# Patient Record
Sex: Male | Born: 1955 | Race: White | Hispanic: Refuse to answer | Marital: Married | Smoking: Never smoker
Health system: Southern US, Community
[De-identification: ages and names within clinical notes are randomized; demographics above are authoritative.]

## PROBLEM LIST (undated history)

## (undated) DIAGNOSIS — J302 Other seasonal allergic rhinitis: Secondary | ICD-10-CM

## (undated) DIAGNOSIS — J930 Spontaneous tension pneumothorax: Secondary | ICD-10-CM

## (undated) DIAGNOSIS — T8859XA Other complications of anesthesia, initial encounter: Secondary | ICD-10-CM

## (undated) DIAGNOSIS — K449 Diaphragmatic hernia without obstruction or gangrene: Secondary | ICD-10-CM

## (undated) DIAGNOSIS — Z8489 Family history of other specified conditions: Secondary | ICD-10-CM

## (undated) DIAGNOSIS — R112 Nausea with vomiting, unspecified: Secondary | ICD-10-CM

## (undated) DIAGNOSIS — T4145XA Adverse effect of unspecified anesthetic, initial encounter: Secondary | ICD-10-CM

## (undated) DIAGNOSIS — Z9889 Other specified postprocedural states: Secondary | ICD-10-CM

## (undated) DIAGNOSIS — R0602 Shortness of breath: Secondary | ICD-10-CM

## (undated) DIAGNOSIS — J189 Pneumonia, unspecified organism: Secondary | ICD-10-CM

## (undated) DIAGNOSIS — E782 Mixed hyperlipidemia: Secondary | ICD-10-CM

## (undated) DIAGNOSIS — R351 Nocturia: Secondary | ICD-10-CM

## (undated) DIAGNOSIS — K219 Gastro-esophageal reflux disease without esophagitis: Secondary | ICD-10-CM

## (undated) HISTORY — DX: Pneumonia, unspecified organism: J18.9

## (undated) HISTORY — DX: Diaphragmatic hernia without obstruction or gangrene: K44.9

## (undated) HISTORY — DX: Spontaneous tension pneumothorax: J93.0

## (undated) HISTORY — DX: Nocturia: R35.1

## (undated) HISTORY — PX: APPENDECTOMY: SHX54

## (undated) HISTORY — DX: Shortness of breath: R06.02

## (undated) HISTORY — DX: Mixed hyperlipidemia: E78.2

## (undated) HISTORY — PX: TONSILLECTOMY: SUR1361

## (undated) HISTORY — DX: Other seasonal allergic rhinitis: J30.2

## (undated) HISTORY — PX: GANGLION CYST EXCISION: SHX1691

## (undated) HISTORY — DX: Gastro-esophageal reflux disease without esophagitis: K21.9

---

## 1997-12-21 ENCOUNTER — Encounter: Payer: Self-pay | Admitting: Gastroenterology

## 2000-07-27 DIAGNOSIS — J930 Spontaneous tension pneumothorax: Secondary | ICD-10-CM

## 2000-07-27 HISTORY — DX: Spontaneous tension pneumothorax: J93.0

## 2001-07-09 ENCOUNTER — Inpatient Hospital Stay (HOSPITAL_COMMUNITY): Admission: EM | Admit: 2001-07-09 | Discharge: 2001-07-13 | Payer: Self-pay | Admitting: Cardiothoracic Surgery

## 2001-07-09 ENCOUNTER — Encounter: Payer: Self-pay | Admitting: Cardiothoracic Surgery

## 2001-07-10 ENCOUNTER — Encounter: Payer: Self-pay | Admitting: Cardiothoracic Surgery

## 2001-07-11 ENCOUNTER — Encounter: Payer: Self-pay | Admitting: Cardiothoracic Surgery

## 2001-07-12 ENCOUNTER — Encounter: Payer: Self-pay | Admitting: Cardiothoracic Surgery

## 2001-07-13 ENCOUNTER — Encounter: Payer: Self-pay | Admitting: Cardiothoracic Surgery

## 2001-07-22 ENCOUNTER — Encounter: Admission: RE | Admit: 2001-07-22 | Discharge: 2001-07-22 | Payer: Self-pay | Admitting: Cardiothoracic Surgery

## 2001-07-22 ENCOUNTER — Encounter: Payer: Self-pay | Admitting: Cardiothoracic Surgery

## 2001-10-26 ENCOUNTER — Encounter: Payer: Self-pay | Admitting: Gastroenterology

## 2008-09-16 ENCOUNTER — Emergency Department (HOSPITAL_COMMUNITY): Admission: EM | Admit: 2008-09-16 | Discharge: 2008-09-16 | Payer: Self-pay | Admitting: Emergency Medicine

## 2008-11-16 ENCOUNTER — Ambulatory Visit: Payer: Self-pay | Admitting: Gastroenterology

## 2008-11-16 DIAGNOSIS — K922 Gastrointestinal hemorrhage, unspecified: Secondary | ICD-10-CM | POA: Insufficient documentation

## 2008-11-16 DIAGNOSIS — K219 Gastro-esophageal reflux disease without esophagitis: Secondary | ICD-10-CM | POA: Insufficient documentation

## 2008-12-21 ENCOUNTER — Ambulatory Visit: Payer: Self-pay | Admitting: Gastroenterology

## 2010-11-11 LAB — POCT CARDIAC MARKERS
CKMB, poc: <1 ng/mL — ABNORMAL LOW (ref 1.0–8.0)
CKMB, poc: <1 ng/mL — ABNORMAL LOW (ref 1.0–8.0)
Myoglobin, poc: 84.5 ng/mL (ref 12–200)
Troponin i, poc: <0.05 ng/mL (ref 0.00–0.09)

## 2010-11-11 LAB — CBC
MCHC: 35.7 g/dL (ref 30.0–36.0)
Platelets: 174 10*3/uL (ref 150–400)
RDW: 13.1 % (ref 11.5–15.5)

## 2010-11-11 LAB — POCT I-STAT, CHEM 8
Calcium, Ion: 1.17 mmol/L (ref 1.12–1.32)
Creatinine, Ser: 1.2 mg/dL (ref 0.4–1.5)
Glucose, Bld: 101 mg/dL — ABNORMAL HIGH (ref 70–99)
HCT: 46 % (ref 39.0–52.0)
Hemoglobin: 15.6 g/dL (ref 13.0–17.0)
TCO2: 26 mmol/L (ref 0–100)

## 2010-11-11 LAB — DIFFERENTIAL
Basophils Absolute: 0 10*3/uL (ref 0.0–0.1)
Basophils Relative: 0 % (ref 0–1)
Lymphocytes Relative: 26 % (ref 12–46)
Neutro Abs: 4.4 10*3/uL (ref 1.7–7.7)

## 2010-11-11 LAB — D-DIMER, QUANTITATIVE: D-Dimer, Quant: <0.22 ug/mL-FEU (ref 0.00–0.48)

## 2010-12-12 NOTE — H&P (Signed)
[place]. [facility]  Patient:    Tyler Taylor, Tyler Taylor Visit Number: [id number] MRN: [id number]          Service Type: Attending:  Mikey Bussing, M.D. Dictated by:   Adair Patter, P.A.-C. Adm. Date:  07/09/01   CC:         CVTS office   History and Physical  CHIEF COMPLAINT:  Right pneumothorax.  HISTORY OF PRESENT ILLNESS:  This is a 55 year old man who reports he was scuba diving approximately one week ago, and began feeling tension on the right side of his chest after diving.  Despite this chest pain, he continued diving, and subsequently felt worse pressure on the right side of his chest after completion of his diving activity.  This chest pressure eventually resulted in the patient obtaining a chest x-ray which showed a right-sided pneumothorax.  The patient subsequently had right-sided chest tube placed. The patient had serial chest x-rays and chest tube was pulled approximately four days after initial placement.  Followup x-ray one day later showed the pneumothorax had returned.  Despite return of pneumothorax, the patient had not had another chest tube placed until further x-rays showed enlargement of the pneumothorax.  He had another chest tube placed because of this enlargement on July 09, 2001, and was transferred to [facility] under the care of Dr. Kathlee Nations Trigt.  Currently, the patient denies any shortness of breath and only reports some moderate discomfort secondary to chest tube placement.  PAST MEDICAL HISTORY: 1. Hiatal hernia. 2. Hemorrhoids.  PAST SURGICAL HISTORY: 1. Open appendectomy. 2. Tonsillectomy. 3. Excision of ganglion on left wrist.  ALLERGIES:  No known drug allergies.  MEDICATIONS: 1. Prilosec one daily. 2. Claritin D one daily.  SOCIAL HISTORY:  The patient lives at home with his wife.  He denies any alcohol, smoking, or illicit drug use.  FAMILY HISTORY:  He denies any family history of  myocardial infarction, hypertension, or cerebrovascular accident.  He has a great uncle who had diabetes mellitus.  Both grandmothers had a history of cancer, one with brain cancer and one with cervical cancer.  REVIEW OF SYSTEMS:  GENERAL:  The patient denies any fever, chills, night sweats, or frequent illnesses.  HEAD:  The patient denies any head injuries. The patient wears reading glasses.  He denies any glaucoma or cataracts. EARS:  He denies any hearing loss or frequent ear infections.  NOSE:  Denies any epistaxis or frequent signs of infection.  MOUTH:  He denies any problems with his dentition or frequent sore throats.  NECK:  He denies any lumps or masses in his neck, or pain with range of motion of neck.  CARDIOVASCULAR: The patient reports in the past he has had a history of chest pain which has been evaluated by his cardiologist with a negative stress test.  No further workup for this chest pain has been indicated.  Denies any history of hypertension or cardiac arrhythmias.  LUNGS:  The patient denies any asthma, bronchitis, or emphysema.  GI:  The patient denies any nausea, vomiting, diarrhea, or constipation.  He has a history of heartburn which was evaluated with EGD, confirming the diagnosis of hiatal hernia for which he takes Prilosec.  He also has a history of hemorrhoids.  Secondary to his hemorrhoids, he has had a positive Hemoccult stool and was evaluated with colonoscopy which was normal.  GU:  The patient denies any urinary tract infections, _________, or dysuria.  MUSCULOSKELETAL:  The  patient denies any arthralgia, arthritis, or myalgias.  NEUROLOGIC:  He denies any depression or memory loss.  PHYSICAL EXAMINATION:  VITAL SIGNS:  The patient is afebrile and has a blood pressure of 148/85, heart rate of 95 and regular, respirations 16.  He has a pulse oximetry of 97% on room air.  GENERAL:  The patient is alert and oriented x 3.  He is well-developed  and well-nourished, and in no state of apparent distress.  HEENT:  Head is atraumatic and normocephalic.  Eyes - pupils are equal, round and reactive to light and accommodation.  Extraocular movements are intact. No scleral icterus or nystagmus.  Ears - auditory acuity is grossly intact. Nose - nasal ______ is intact.  Sinuses are nontender.  Mouth is moist without exudate.  NECK:  Supple without JVD, carotid bruits, lymphadenopathy, or thyromegaly.  CARDIOVASCULAR EXAMINATION:  Revealed regular rate and rhythm without murmurs, gallops, or rubs.  LUNGS:  Bilaterally clear to auscultation, equal breath sounds bilaterally.  ABDOMEN:  Soft, nontender, and nondistended with positive bowel sounds in all four quadrants.  EXTREMITIES:  Revealed no cyanosis, clubbing, or edema, or gross orthopedic abnormalities.  NEUROLOGIC EXAMINATION:  Revealed no focal neurologic deficits.  Cranial nerves 2-12 are grossly intact.  LABORATORY DATA:  Chest x-ray done today reveals the right chest tube in good position.  His right lung is fully expanded.  The patient has a right chest tube in place with no air leak.  IMPRESSION:  Recurrent right pneumothorax, status post chest tube placement.  PLAN:  The patient will be admitted to [facility] under the care of Dr. Kathlee Nations Trigt.  His chest tube will be kept on waterseal, assuming his lung remains fully expanded.  We will follow him with serial chest x-rays, and hopefully remove chest tube in two to three days if lung stays fully expanded. Further intervention may be indicated if his chest tube fails to resolve his pneumothorax. Dictated by:   Adair Patter, P.A.-C. Attending:  Mikey Bussing, M.D. DD:  07/09/01 TD:  07/09/01 Job: [id number]

## 2010-12-12 NOTE — Discharge Summary (Signed)
[place]. [facility]  Patient:    Tyler Taylor, Tyler Taylor Visit Number: [id number] MRN: [id number]          Service Type: MED Location: [id number] Attending Physician:  Mikey Bussing Dictated by:   Loura Pardon, P.A. Admit Date:  07/09/2001 Disc. Date: 07/13/01   CC:         Jamesetta Geralds, M.D.   Discharge Summary  DATE OF BIRTH:  [birth year]  DISCHARGE DIAGNOSIS:  Right pneumothorax.  SECONDARY DIAGNOSES: 1. Hiatal hernia. 2. History of hemorrhoids. 3. Status post appendectomy. 4. Status post tonsillectomy. 5. Status post removal of left wrist ganglia.  PROCEDURE:  Monitoring of existing right anterior thoracostomy tube with serial x-rays while at [facility], Dr. Kathlee Nations Trigt attending surgeon.  DISCHARGE DISPOSITION:  Tyler Taylor presented to [facility] on July 09, 2001, with right anterior thoracostomy tube which had been placed at another facility.  This was actually his second thoracostomy tube.  He had a right pneumothorax after scuba diving in the [place].  A thoracostomy tube was placed here, and right pneumothorax recurred after removal of the tube after four days.  He has been given serial chest x-rays here at [facility], which have all showed full expansion of the right lung . He has been kept on no suction, and has had no drainage from the chest tube.  He did not exhibit any leak at any time.  He has remained afebrile, his mental status has been clear, he has been actively ambulating.  His pain has been controlled with oral analgesics.  His appetite is good.  He has full GI tract function. The chest tube was removed on July 12, 2001, and a followup chest x-ray showed a small less than 5% right apical pneumothorax.  The patient did relate that he felt suction of air into the chest with inspiration, and a followup x-ray was taken.  In addition, a pursestring suture was placed at the incision, and  dressing reapplied.  Chest x-ray in followup on the morning of July 13, 2001, showed no advancement of the right pneumothorax, and the patient was discharged.  DISCHARGE MEDICATIONS: 1. Percocet 5/325 mg one or two p.o. q.4-6h. p.r.n. pain. 2. Prilosec 20 mg q.d. 3. Claritin D one q.d.  HISTORY OF PRESENT ILLNESS:  Tyler Taylor is a 55 year old male.  He was scuba diving approximately one week ago, and began feeling tension on the right side of his chest after diving.  He did continue diving, and felt worse pressure on the right side after completing his diving activity.  The patient did obtain a chest x-ray which showed right-sided pneumothorax.  He had a right thoracostomy tube placed, and after serial chest x-rays, chest tube was pulled approximately 4 days after placement.  Followup chest x-ray one day later showed return of the pneumothorax.  A second chest tube was placed, and the patient was transferred by aerial ambulance to his home.  He presents to [facility] for further monitoring of his right pneumothorax and the right thoracostomy tube.  HOSPITAL COURSE:  Has been described in discharge disposition.  He was admitted to [facility] on July 09, 2001.  He has undergone serial chest x-rays daily.  They show no advancement or return of the pneumothorax. The patient has been maintained off suction.  The chest tube was removed on hospital day #4, with no subsequent return of pneumothorax. Dictated by:  Loura Pardon, P.A. Attending Physician:  Mikey Bussing DD:  07/12/01 TD:  07/13/01 Job: [id number]

## 2011-09-14 ENCOUNTER — Other Ambulatory Visit: Payer: Self-pay | Admitting: Otolaryngology

## 2011-09-14 DIAGNOSIS — J328 Other chronic sinusitis: Secondary | ICD-10-CM

## 2011-09-23 ENCOUNTER — Ambulatory Visit
Admission: RE | Admit: 2011-09-23 | Discharge: 2011-09-23 | Disposition: A | Discharge status: Home / Self Care | Payer: Private Health Insurance - Indemnity | Source: Ambulatory Visit | Attending: Otolaryngology | Admitting: Otolaryngology

## 2011-09-23 DIAGNOSIS — J328 Other chronic sinusitis: Secondary | ICD-10-CM

## 2011-09-23 MED ORDER — IOHEXOL 300 MG/ML  SOLN
75.0000 mL | Freq: Once | INTRAMUSCULAR | Status: AC | PRN
  Administered 2011-09-23: 75 mL via INTRAVENOUS

## 2011-10-26 DIAGNOSIS — J189 Pneumonia, unspecified organism: Secondary | ICD-10-CM

## 2011-10-26 HISTORY — DX: Pneumonia, unspecified organism: J18.9

## 2011-11-11 ENCOUNTER — Encounter: Payer: Self-pay | Admitting: Pulmonary Disease

## 2011-11-12 ENCOUNTER — Encounter: Payer: Self-pay | Admitting: Pulmonary Disease

## 2011-11-12 ENCOUNTER — Ambulatory Visit (INDEPENDENT_AMBULATORY_CARE_PROVIDER_SITE_OTHER): Payer: Private Health Insurance - Indemnity | Admitting: Pulmonary Disease

## 2011-11-12 VITALS — BP 138/80 | HR 105 | Temp 98.6°F | Wt 218.4 lb

## 2011-11-12 DIAGNOSIS — J309 Allergic rhinitis, unspecified: Secondary | ICD-10-CM | POA: Insufficient documentation

## 2011-11-12 DIAGNOSIS — J189 Pneumonia, unspecified organism: Secondary | ICD-10-CM

## 2011-11-12 MED ORDER — HYDROCOD POLST-CHLORPHEN POLST 10-8 MG/5ML PO LQCR: 5.0000 mL | Freq: Two times a day (BID) | ORAL | Status: DC | PRN

## 2011-11-12 NOTE — Assessment & Plan Note (Signed)
He is followed by Dr. Haroldine Laws and Dr. Kathyrn Lass group for this.

## 2011-11-12 NOTE — Progress Notes (Deleted)
  Subjective:    Patient ID: Tyler Taylor, male    DOB: 08-16-1955, 56 y.o.   MRN: [id number]  HPI    Review of Systems  Constitutional: Negative for fever, appetite change and unexpected weight change.  HENT: Positive for congestion. Negative for ear pain, sore throat, trouble swallowing and dental problem.   Respiratory: Positive for cough, chest tightness and wheezing. Negative for shortness of breath.   Cardiovascular: Negative for chest pain, palpitations and leg swelling.  Gastrointestinal: Negative for abdominal pain.  Musculoskeletal: Negative for joint swelling.  Skin: Negative for rash.  Neurological: Negative for headaches.  Psychiatric/Behavioral: Negative for dysphoric mood. The patient is not nervous/anxious.        Objective:   Physical Exam        Assessment & Plan:

## 2011-11-12 NOTE — Assessment & Plan Note (Addendum)
He has left lower lobe pneumonia.  He has clinical improvement with antibiotic therapy.  I have advised him to complete his prescribed course of antibiotics.  He is to finish his prescribed course of prednisone.  I don't think he needs a change to his antibiotics or additional prednisone  Will have him f/u in 2 weeks with chest xray to document clearing of his infiltrate.  I have refilled his tussionex.

## 2011-11-12 NOTE — Progress Notes (Signed)
Chief Complaint  Patient presents with  . Advice Only    Pt here to f/u on PNA dx 11/09/11. Pt c/o cough w/ cleasr phlem, wheezing and chest tx when lying down, loss of voice. denies any f/c/s/v    History of Present Illness: Tyler Taylor is a 56 y.o. male for evaluation of pneumonia.  His symptoms started about 10 days.  He had body aches, chills and felt feverish.  He then developed a dry cough, but did occasional have clear sputum.    He was seen in urgent care.  He declined chest xray or fluoroquinolone therapy.  He was started on flagyl and cefdinir.  He started to feel better, and was able to return to work.  He then developed hoarseness.  He was seen by ENT, and told he had some wheeze.  He was started on prednisone, tussionex and singulair.  He also had a chest xray which showed pneumonia.  He was then advised to have pulmonary evaluation.  He is followed by Dr. Kathyrn Lass group for allergies.  He has not been told he has asthma.  He gets asthmatic bronchitis with colds, but does not have trouble with his breathing otherwise.    He feels his cough is improving.  He denies rash, gland swelling, headaches, chest pain, nausea, diarrhea, or leg swelling.  He has not had recent sick exposures.  There is no prior history of pneumonia or tuberculosis.  There is no history of smoking.  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  He is concerned that his cough and hoarseness are affecting his ability to work.   Past Medical History  Diagnosis Date  . GERD (gastroesophageal reflux disease)   . Hiatal hernia   . Tension pneumothorax 2002    from diving  . Hemorrhoids     Past Surgical History  Procedure Date  . Appendectomy   . Tonsillectomy   . Ganglion cyst excision     left wrist    Current Outpatient Prescriptions on File Prior to Visit  Medication Sig Dispense Refill  .  loratadine (CLARITIN) 10 MG tablet Take 10 mg by mouth daily.      . mometasone (NASONEX) 50 MCG/ACT nasal spray Place 2 sprays into the nose daily. On hold since dx w/ pna      . montelukast (SINGULAIR) 10 MG tablet Take 10 mg by mouth daily.      . Olopatadine HCl (PATANASE) 0.6 % SOLN Place 2 puffs into the nose daily.        No Known Allergies  family history is not on file.   reports that he has never smoked. He does not have any smokeless tobacco history on file. He reports that he does not drink alcohol or use illicit drugs.  Review of Systems  Constitutional: Negative for fever, appetite change and unexpected weight change.  HENT: Positive for congestion. Negative for ear pain, sore throat, trouble swallowing and dental problem.   Respiratory: Positive for cough, chest tightness and wheezing. Negative for shortness of breath.   Cardiovascular: Negative for chest pain, palpitations and leg swelling.  Gastrointestinal: Negative for abdominal pain.  Musculoskeletal: Negative for joint swelling.  Skin: Negative for rash.  Neurological: Negative for headaches.  Psychiatric/Behavioral: Negative for dysphoric mood. The patient is not nervous/anxious.     Physical Exam: BP 138/80  Pulse 105  Temp(Src) 98.6 F (37 C) (Oral)  Wt 218 lb 6.4 oz (99.066 kg)  SpO2 96%   General - No distress HEENT - PERRLA, EOMI, no sinus tenderness, no oral exudate, no LAN, no thyromegaly, TM clear Cardiac - s1s2 regular, no murmur Chest - good air entry, no wheeze/rales/dullness, normal respiratory excursion Abdomen - soft, nontender, no organomegaly, + bowel sounds Extremities - no e/c/c  Neurologic - normal strength, CN intact Skin - no rashes Psychiatric - normal mood, behavior  CXR 11/09/11 ([facility])>>Lt medial lower lobe infiltrate  Assessment/Plan:  Outpatient Encounter Prescriptions as of 11/12/2011  Medication Sig Dispense Refill  . cefdinir (OMNICEF) 300 MG capsule Take 300  mg by mouth 2 (two) times daily.      . CHLORPHENIRAMINE-HYDROCODONE PO Take by mouth 3 (three) times daily.      Marland Kitchen loratadine (CLARITIN) 10 MG tablet Take 10 mg by mouth daily.      . metroNIDAZOLE (FLAGYL) 500 MG tablet Take 500 mg by mouth 2 (two) times daily.      . mometasone (NASONEX) 50 MCG/ACT nasal spray Place 2 sprays into the nose daily. On hold since dx w/ pna      . montelukast (SINGULAIR) 10 MG tablet Take 10 mg by mouth daily.      . Olopatadine HCl (PATANASE) 0.6 % SOLN Place 2 puffs into the nose daily.      . prednisoLONE 5 MG TABS Taper as directed      . pseudoephedrine-guaifenesin (MUCINEX D) 60-600 MG per tablet Take 1 tablet by mouth daily.      . chlorpheniramine-HYDROcodone (TUSSIONEX) 10-8 MG/5ML LQCR Take 5 mLs by mouth every 12 (twelve) hours as needed.  115 mL  0    [facility] [place] Pager:  [phone] 11/12/2011, 9:57 AM

## 2011-11-12 NOTE — Patient Instructions (Signed)
Follow up in 2 weeks with chest xray 

## 2011-11-30 ENCOUNTER — Ambulatory Visit (INDEPENDENT_AMBULATORY_CARE_PROVIDER_SITE_OTHER)
Admission: RE | Admit: 2011-11-30 | Discharge: 2011-11-30 | Disposition: A | Discharge status: Home / Self Care | Payer: Private Health Insurance - Indemnity | Source: Ambulatory Visit | Attending: Adult Health | Admitting: Adult Health

## 2011-11-30 ENCOUNTER — Ambulatory Visit (INDEPENDENT_AMBULATORY_CARE_PROVIDER_SITE_OTHER): Payer: Private Health Insurance - Indemnity | Admitting: Adult Health

## 2011-11-30 ENCOUNTER — Encounter: Payer: Self-pay | Admitting: Adult Health

## 2011-11-30 VITALS — BP 114/76 | HR 94 | Temp 97.7°F | Ht 73.0 in | Wt 222.8 lb

## 2011-11-30 DIAGNOSIS — J189 Pneumonia, unspecified organism: Secondary | ICD-10-CM

## 2011-11-30 NOTE — Progress Notes (Signed)
  Subjective:    Patient ID: Tyler Taylor, male    DOB: [birth year], 56 y.o.   MRN: [id number]  HPI 56 yo seen for initial pulmonary consult 11/12/11 for Pneumonia   11/30/2011 Follow up  Seen 2 weeks ago for PNA. Patient was referred from his allergist for evaluation of pneumonia. He. Chest x-ray showed a left lower lobe consolidation. He was treated with Flagyl and Ceftin air. Patient returns today for followup. He reports that he is feeling much improved with resolution of cough and congestion. Chest x-ray to date shows total resolution of consolidation. He denies any fever, chest pain, shortness of breath, leg swelling, or rash. He does feel that his energy level has returned back to his baseline. He is return back to work  Review of Systems Constitutional:   No  weight loss, night sweats,  Fevers, chills, fatigue, or  lassitude.  HEENT:   No headaches,  Difficulty swallowing,  Tooth/dental problems, or  Sore throat,                No sneezing, itching, ear ache, nasal congestion, post nasal drip,   CV:  No chest pain,  Orthopnea, PND, swelling in lower extremities, anasarca, dizziness, palpitations, syncope.   GI  No heartburn, indigestion, abdominal pain, nausea, vomiting, diarrhea, change in bowel habits, loss of appetite, bloody stools.   Resp: No shortness of breath with exertion or at rest.  No excess mucus, no productive cough,  No non-productive cough,  No coughing up of blood.  No change in color of mucus.  No wheezing.  No chest wall deformity  Skin: no rash or lesions.  GU: no dysuria, change in color of urine, no urgency or frequency.  No flank pain, no hematuria   MS:  No joint pain or swelling.  No decreased range of motion.  No back pain.  Psych:  No change in mood or affect. No depression or anxiety.  No memory loss.          Objective:   Physical Exam GEN: A/Ox3; pleasant , NAD, well nourished   HEENT:  [place]/AT,  EACs-clear, TMs-wnl, NOSE-clear, THROAT-clear, no  lesions, no postnasal drip or exudate noted.   NECK:  Supple w/ fair ROM; no JVD; normal carotid impulses w/o bruits; no thyromegaly or nodules palpated; no lymphadenopathy.  RESP  Clear  P & A; w/o, wheezes/ rales/ or rhonchi.no accessory muscle use, no dullness to percussion  CARD:  RRR, no m/r/g  , no peripheral edema, pulses intact, no cyanosis or clubbing.  GI:   Soft & nt; nml bowel sounds; no organomegaly or masses detected.  Musco: Warm bil, no deformities or joint swelling noted.   Neuro: alert, no focal deficits noted.    Skin: Warm, no lesions or rashes '   CXR : NAD      Assessment & Plan:

## 2011-11-30 NOTE — Patient Instructions (Signed)
Continue on same regimen  Follow up As needed  With Dr. Craige Cotta

## 2011-11-30 NOTE — Progress Notes (Signed)
Reviewed and agree with assessment/plan. 

## 2011-11-30 NOTE — Assessment & Plan Note (Signed)
Resolved on xray, pt clinically improved w/ return to his baseline  follow up As needed  With Dr. Craige Cotta

## 2011-12-11 ENCOUNTER — Telehealth: Payer: Self-pay | Admitting: Pulmonary Disease

## 2011-12-11 MED ORDER — HYDROCOD POLST-CHLORPHEN POLST 10-8 MG/5ML PO LQCR: 5.0000 mL | Freq: Two times a day (BID) | ORAL | Status: DC | PRN

## 2011-12-11 NOTE — Telephone Encounter (Signed)
rx printed and signed by Dr. Craige Cotta.. Rx given to the pt. Carron Curie, CMA

## 2011-12-11 NOTE — Telephone Encounter (Signed)
Pt is requesting a refill on tussionex. Last fill on 11-12-11 #135ml. Pt is in [place] waiting on Rx because he is going out of town and wants to have this with him. Please advise.Carron Curie, CMA

## 2011-12-11 NOTE — Telephone Encounter (Signed)
Okay to refill script with one refill.  He is not schedule for pulmonary follow up.  He will need to d/w his PCP in future for refill of this.  Send script for tussionex 5 ml bid prn cough, dispense 115 ml with one refill.

## 2012-06-20 ENCOUNTER — Telehealth: Payer: Self-pay | Admitting: Gastroenterology

## 2012-06-20 NOTE — Telephone Encounter (Signed)
Patient is scheduled to see Dr. Russella Dar on 06/27/12

## 2012-06-27 ENCOUNTER — Ambulatory Visit (INDEPENDENT_AMBULATORY_CARE_PROVIDER_SITE_OTHER): Payer: Private Health Insurance - Indemnity | Admitting: Gastroenterology

## 2012-06-27 ENCOUNTER — Encounter: Payer: Self-pay | Admitting: Gastroenterology

## 2012-06-27 VITALS — BP 136/82 | HR 92 | Ht 73.0 in | Wt 225.5 lb

## 2012-06-27 DIAGNOSIS — K921 Melena: Secondary | ICD-10-CM

## 2012-06-27 DIAGNOSIS — K649 Unspecified hemorrhoids: Secondary | ICD-10-CM

## 2012-06-27 MED ORDER — MESALAMINE 1000 MG RE SUPP: 1000.0000 mg | Freq: Every day | RECTAL | Status: DC

## 2012-06-27 NOTE — Progress Notes (Signed)
History of Present Illness: This is a 56 year old male with a history of internal and external hemorrhoids. He underwent colonoscopy in 1999 and 2010 both with the same findings of internal and external hemorrhoids. His hemorrhoids were injected with 23.4% saline in 2010. He states for about the past 2-3 years he has had frequent bright red rectal bleeding with bowel movements and occasional bright red bleeding in between bowel movements. His symptoms are generally worsening. Denies weight loss, abdominal pain, constipation, diarrhea, change in stool caliber, melena, nausea, vomiting, dysphagia, reflux symptoms, chest pain.  Review of Systems: Pertinent positive and negative review of systems were noted in the above HPI section. All other review of systems were otherwise negative.  Current Medications, Allergies, Past Medical History, Past Surgical History, Family History and Social History were reviewed in [employer] record.  Physical Exam: General: Well developed , well nourished, no acute distress Head: Normocephalic and atraumatic Eyes:  sclerae anicteric, EOMI Ears: Normal auditory acuity Mouth: No deformity or lesions Neck: Supple, no masses or thyromegaly Lungs: Clear throughout to auscultation Heart: Regular rate and rhythm; no murmurs, rubs or bruits Abdomen: Soft, non tender and non distended. No masses, hepatosplenomegaly or hernias noted. Normal Bowel sounds Rectal: Small external hemorrhoidal tags no internal lesions Hemoccult negative brown stool Musculoskeletal: Symmetrical with no gross deformities  Skin: No lesions on visible extremities Pulses:  Normal pulses noted Extremities: No clubbing, cyanosis, edema or deformities noted Neurological: Alert oriented x 4, grossly nonfocal Cervical Nodes:  No significant cervical adenopathy Inguinal Nodes: No significant inguinal adenopathy Psychological:  Alert and cooperative. Normal mood and  affect  Assessment and Recommendations:  1. Frequent hematochezia with a history of internal and external hemorrhoids. He failed injection sclerosis with 23.4% saline. Surgical referral for further evaluation and consideration of formal hemorrhoidectomy.

## 2012-06-27 NOTE — Patient Instructions (Addendum)
We have sent the following medications to your pharmacy for you to pick up at your convenience:Canasa suppositories.  We will call you with an appointment date and time with [facility] with Dr. Romie Levee.

## 2012-06-28 ENCOUNTER — Ambulatory Visit: Payer: Private Health Insurance - Indemnity | Admitting: Gastroenterology

## 2012-07-18 ENCOUNTER — Ambulatory Visit (INDEPENDENT_AMBULATORY_CARE_PROVIDER_SITE_OTHER): Payer: Private Health Insurance - Indemnity | Admitting: General Surgery

## 2012-07-18 ENCOUNTER — Encounter (INDEPENDENT_AMBULATORY_CARE_PROVIDER_SITE_OTHER): Payer: Self-pay | Admitting: General Surgery

## 2012-07-18 VITALS — BP 132/82 | HR 68 | Temp 97.8°F | Resp 18 | Ht 73.0 in | Wt 226.0 lb

## 2012-07-18 DIAGNOSIS — K625 Hemorrhage of anus and rectum: Secondary | ICD-10-CM

## 2012-07-18 NOTE — Progress Notes (Signed)
Chief Complaint  Patient presents with  . New Evaluation    hems    HISTORY: Tyler Taylor is a 56 y.o. male who presents to the office with rectal bleedings.   He has tried OTC and injections in the past with some success.  BM's makes the symptoms worse.  This had been occurring for several years.  He has a long standing history of constipation but has been taking fiber for the last 3 years with good results.  It is occurs with every BM and has been like that for at least 5 yrs.  His bowel habits are regular and his bowel movements are soft.  His fiber intake is good.  His last colonoscopy was 3 yrs ago by Dr Russella Dar. He injected his hemorrhoids at that time.  He denies any prolapsing tissue. He denies any itching or pain.  He is not on any blood thinners.     Past Medical History  Diagnosis Date  . GERD (gastroesophageal reflux disease)   . Hiatal hernia   . Tension pneumothorax 2002    from diving  . Hemorrhoids   . Seasonal allergic rhinitis   . Pneumonia April 2013      Past Surgical History  Procedure Date  . Appendectomy   . Tonsillectomy   . Ganglion cyst excision     left wrist        Current Outpatient Prescriptions  Medication Sig Dispense Refill  . cefUROXime (CEFTIN) 250 MG tablet Take 250 mg by mouth 2 (two) times daily.      Marland Kitchen loratadine (CLARITIN) 10 MG tablet Take 10 mg by mouth daily.      . metroNIDAZOLE (FLAGYL) 500 MG tablet Take 500 mg by mouth once.      . mometasone (NASONEX) 50 MCG/ACT nasal spray Place 2 sprays into the nose daily. On hold since dx w/ pna      . Olopatadine HCl (PATANASE) 0.6 % SOLN Place 2 puffs into the nose daily.      Marland Kitchen omeprazole (PRILOSEC) 40 MG capsule Take 40 mg by mouth daily.      . pravastatin (PRAVACHOL) 40 MG tablet Take 40 mg by mouth daily.      . pseudoephedrine-guaifenesin (MUCINEX D) 60-600 MG per tablet Take 1 tablet by mouth daily.      . mesalamine (CANASA) 1000 MG suppository Place 1 suppository (1,000 mg total)  rectally at bedtime.  30 suppository  1      No Known Allergies    No family history on file.  History   Social History  . Marital Status: Married    Spouse Name: N/A    Number of Children: 2  . Years of Education: N/A   Occupational History  . vetenarian    Social History Main Topics  . Smoking status: Never Smoker   . Smokeless tobacco: Never Used  . Alcohol Use: No  . Drug Use: No  . Sexually Active: None   Other Topics Concern  . None   Social History Narrative   About 6 cokes      REVIEW OF SYSTEMS - PERTINENT POSITIVES ONLY: Review of Systems - General ROS: negative Respiratory ROS: no cough, shortness of breath, or wheezing Cardiovascular ROS: no chest pain or dyspnea on exertion Gastrointestinal ROS: no abdominal pain, change in bowel habits, or black or bloody stools Genito-Urinary ROS: no dysuria, trouble voiding, or hematuria  EXAM: Filed Vitals:   07/18/12 1529  BP: 132/82  Pulse:  68  Temp: 97.8 F (36.6 C)  Resp: 18    General appearance: alert and cooperative Resp: clear to auscultation bilaterally Cardio: regular rate and rhythm GI: soft, non-tender; bowel sounds normal; no masses,  no organomegaly   Procedure: Anoscopy Surgeon: Tyler Taylor Diagnosis: rectal bleeding  Assistant: Tyler Taylor After the risks and benefits were explained, verbal consent was obtained for above procedure  Anesthesia: none Findings: grade 2 bleeding left lateral and RA hemorrhoids, bleeding at dentate line      ASSESSMENT AND PLAN: Tyler Taylor is a 56 y.o. M who has failed medical management of his bleeding internal hemorrhoids.  We discussed treatment options that included injecting his hemorrhoids again, banding his hemorrhoids, stapling is hemorrhoids, THD and surgical resection. I do not think that he would be a great banding candidate because the area that the bleeding is very close to the dentate line. We discussed PPH and the complications they're associated  with this. I recommended that he undergo THD.  We discussed the risk and benefits of this procedure. We discussed that he will most likely have some pelvic discomfort pressure and pain for approximately a week. This should get better with time. We discussed the importance of not getting constipated after the surgery. We discussed the recurrence rate of about 20%. We discussed ways that he can decrease this postoperatively. He would like to proceed with surgery.    Tyler Panda, MD Colon and Rectal Surgery / General Surgery [facility]      Visit Diagnoses: 1. Rectal bleeding     Primary Care Physician: No primary provider on file.

## 2012-07-18 NOTE — Patient Instructions (Signed)

## 2012-07-19 ENCOUNTER — Encounter (HOSPITAL_COMMUNITY): Payer: Self-pay | Admitting: Pharmacy Technician

## 2012-07-19 ENCOUNTER — Telehealth (INDEPENDENT_AMBULATORY_CARE_PROVIDER_SITE_OTHER): Payer: Self-pay

## 2012-07-19 NOTE — Telephone Encounter (Signed)
Message copied by Ivory Broad on Tue Jul 19, 2012  8:50 AM ------      Message from: Zollie Scale      Created: Mon Jul 18, 2012  4:32 PM      Contact: [phone]       Please call pt with po apt pt said he travels a lot could not find apt

## 2012-07-19 NOTE — Telephone Encounter (Signed)
I called and gave the pt an appointment for 08/05/12 at 4:30pm

## 2012-07-21 ENCOUNTER — Encounter (HOSPITAL_COMMUNITY): Payer: Self-pay | Admitting: *Deleted

## 2012-07-21 NOTE — Progress Notes (Signed)
Unable to come for the only appointment available today- states has clinic-  Stated would be ok to obtain history from wife [facility]- completed as per wife    Chest x ray 5/13 EPIC

## 2012-07-22 ENCOUNTER — Encounter (HOSPITAL_COMMUNITY): Payer: Self-pay | Admitting: *Deleted

## 2012-07-22 ENCOUNTER — Ambulatory Visit (HOSPITAL_COMMUNITY)
Admission: RE | Admit: 2012-07-22 | Discharge: 2012-07-22 | Disposition: A | Discharge status: Home / Self Care | Payer: Managed Care, Other (non HMO) | Source: Ambulatory Visit | Attending: General Surgery | Admitting: General Surgery

## 2012-07-22 ENCOUNTER — Ambulatory Visit (HOSPITAL_COMMUNITY): Payer: Managed Care, Other (non HMO) | Admitting: Registered Nurse

## 2012-07-22 ENCOUNTER — Encounter (HOSPITAL_COMMUNITY)
Admission: RE | Disposition: A | Discharge status: Home / Self Care | Payer: Self-pay | Source: Ambulatory Visit | Attending: General Surgery

## 2012-07-22 ENCOUNTER — Encounter (HOSPITAL_COMMUNITY): Payer: Self-pay | Admitting: Registered Nurse

## 2012-07-22 DIAGNOSIS — K449 Diaphragmatic hernia without obstruction or gangrene: Secondary | ICD-10-CM | POA: Insufficient documentation

## 2012-07-22 DIAGNOSIS — K648 Other hemorrhoids: Secondary | ICD-10-CM

## 2012-07-22 DIAGNOSIS — K219 Gastro-esophageal reflux disease without esophagitis: Secondary | ICD-10-CM | POA: Insufficient documentation

## 2012-07-22 DIAGNOSIS — Z79899 Other long term (current) drug therapy: Secondary | ICD-10-CM | POA: Insufficient documentation

## 2012-07-22 HISTORY — PX: TRANSANAL HEMORRHOIDAL DEARTERIALIZATION: SHX6136

## 2012-07-22 LAB — CBC
MCH: 29.9 pg (ref 26.0–34.0)
MCHC: 34.4 g/dL (ref 30.0–36.0)
Platelets: 200 10*3/uL (ref 150–400)

## 2012-07-22 SURGERY — TRANSANAL HEMORRHOIDAL DEARTERIALIZATION
Anesthesia: General | Site: Rectum | Wound class: Dirty or Infected

## 2012-07-22 MED ORDER — SODIUM CHLORIDE 0.9 % IJ SOLN: 3.0000 mL | Freq: Two times a day (BID) | INTRAMUSCULAR | Status: DC

## 2012-07-22 MED ORDER — PROPOFOL INFUSION 10 MG/ML OPTIME
INTRAVENOUS | Status: DC | PRN
  Administered 2012-07-22: 180 ug/kg/min via INTRAVENOUS

## 2012-07-22 MED ORDER — LACTATED RINGERS IV SOLN
INTRAVENOUS | Status: DC | PRN
  Administered 2012-07-22 (×2): via INTRAVENOUS

## 2012-07-22 MED ORDER — DEXAMETHASONE SODIUM PHOSPHATE 10 MG/ML IJ SOLN
INTRAMUSCULAR | Status: DC | PRN
  Administered 2012-07-22: 10 mg via INTRAVENOUS

## 2012-07-22 MED ORDER — LACTATED RINGERS IV SOLN: INTRAVENOUS | Status: DC

## 2012-07-22 MED ORDER — PROPOFOL 10 MG/ML IV EMUL
INTRAVENOUS | Status: DC | PRN
Start: 2012-07-22 — End: 2012-07-22
  Administered 2012-07-22: 200 mg via INTRAVENOUS

## 2012-07-22 MED ORDER — BUPIVACAINE-EPINEPHRINE 0.25% -1:200000 IJ SOLN
INTRAMUSCULAR | Status: DC | PRN
  Administered 2012-07-22: 50 mL

## 2012-07-22 MED ORDER — ACETAMINOPHEN 10 MG/ML IV SOLN
INTRAVENOUS | Status: AC
  Filled 2012-07-22: qty 100

## 2012-07-22 MED ORDER — SODIUM CHLORIDE 0.9 % IV SOLN: 250.0000 mL | INTRAVENOUS | Status: DC | PRN

## 2012-07-22 MED ORDER — ONDANSETRON HCL 4 MG/2ML IJ SOLN
INTRAMUSCULAR | Status: DC | PRN
  Administered 2012-07-22: 4 mg via INTRAVENOUS

## 2012-07-22 MED ORDER — SUFENTANIL CITRATE 50 MCG/ML IV SOLN
INTRAVENOUS | Status: DC | PRN
  Administered 2012-07-22 (×8): 5 ug via INTRAVENOUS

## 2012-07-22 MED ORDER — ACETAMINOPHEN 650 MG RE SUPP
650.0000 mg | RECTAL | Status: DC | PRN
  Filled 2012-07-22: qty 1

## 2012-07-22 MED ORDER — MUPIROCIN 2 % EX OINT
TOPICAL_OINTMENT | Freq: Two times a day (BID) | CUTANEOUS | Status: DC
  Administered 2012-07-22: 1 via NASAL
  Filled 2012-07-22: qty 22

## 2012-07-22 MED ORDER — MEPERIDINE HCL 50 MG/ML IJ SOLN: 6.2500 mg | INTRAMUSCULAR | Status: DC | PRN

## 2012-07-22 MED ORDER — LIDOCAINE HCL 1 % IJ SOLN
INTRAMUSCULAR | Status: DC | PRN
  Administered 2012-07-22: 70 mg via INTRADERMAL

## 2012-07-22 MED ORDER — MIDAZOLAM HCL 5 MG/5ML IJ SOLN
INTRAMUSCULAR | Status: DC | PRN
  Administered 2012-07-22: 2 mg via INTRAVENOUS

## 2012-07-22 MED ORDER — DOCUSATE SODIUM 100 MG PO CAPS: 100.0000 mg | ORAL_CAPSULE | Freq: Two times a day (BID) | ORAL | Status: AC

## 2012-07-22 MED ORDER — SODIUM CHLORIDE 0.9 % IJ SOLN: 3.0000 mL | INTRAMUSCULAR | Status: DC | PRN

## 2012-07-22 MED ORDER — OXYCODONE-ACETAMINOPHEN 7.5-500 MG PO TABS: 1.0000 | ORAL_TABLET | ORAL | Status: DC | PRN

## 2012-07-22 MED ORDER — FIBER 625 MG PO TABS: 2.0000 | ORAL_TABLET | Freq: Every day | ORAL | Status: DC

## 2012-07-22 MED ORDER — ACETAMINOPHEN 325 MG PO TABS: 650.0000 mg | ORAL_TABLET | ORAL | Status: DC | PRN

## 2012-07-22 MED ORDER — PROMETHAZINE HCL 25 MG/ML IJ SOLN: 6.2500 mg | INTRAMUSCULAR | Status: DC | PRN

## 2012-07-22 MED ORDER — BUPIVACAINE LIPOSOME 1.3 % IJ SUSP
20.0000 mL | Freq: Once | INTRAMUSCULAR | Status: AC
  Administered 2012-07-22: 20 mL
  Filled 2012-07-22: qty 20

## 2012-07-22 MED ORDER — BUPIVACAINE-EPINEPHRINE 0.25% -1:200000 IJ SOLN
INTRAMUSCULAR | Status: AC
  Filled 2012-07-22: qty 1

## 2012-07-22 MED ORDER — SCOPOLAMINE 1 MG/3DAYS TD PT72
MEDICATED_PATCH | TRANSDERMAL | Status: DC | PRN
  Administered 2012-07-22: 1 via TRANSDERMAL

## 2012-07-22 MED ORDER — SCOPOLAMINE 1 MG/3DAYS TD PT72
MEDICATED_PATCH | TRANSDERMAL | Status: AC
  Filled 2012-07-22: qty 1

## 2012-07-22 MED ORDER — FLEET ENEMA 7-19 GM/118ML RE ENEM: 1.0000 | ENEMA | Freq: Once | RECTAL | Status: DC

## 2012-07-22 MED ORDER — ONDANSETRON HCL 4 MG/2ML IJ SOLN: 4.0000 mg | Freq: Four times a day (QID) | INTRAMUSCULAR | Status: DC | PRN

## 2012-07-22 MED ORDER — FENTANYL CITRATE 0.05 MG/ML IJ SOLN: 25.0000 ug | INTRAMUSCULAR | Status: DC | PRN

## 2012-07-22 MED ORDER — OXYCODONE HCL 5 MG PO TABS: 5.0000 mg | ORAL_TABLET | ORAL | Status: DC | PRN

## 2012-07-22 SURGICAL SUPPLY — 35 items
BLADE EXTENDED COATED 6.5IN (ELECTRODE) IMPLANT
BLADE HEX COATED 2.75 (ELECTRODE) ×2 IMPLANT
BRIEF STRETCH FOR OB PAD LRG (UNDERPADS AND DIAPERS) ×1 IMPLANT
CANISTER SUCTION 2500CC (MISCELLANEOUS) ×2 IMPLANT
CLOTH BEACON ORANGE TIMEOUT ST (SAFETY) ×2 IMPLANT
DECANTER SPIKE VIAL GLASS SM (MISCELLANEOUS) ×2 IMPLANT
DRSG PAD ABDOMINAL 8X10 ST (GAUZE/BANDAGES/DRESSINGS) IMPLANT
ELECT REM PT RETURN 9FT ADLT (ELECTROSURGICAL) ×2
ELECTRODE REM PT RTRN 9FT ADLT (ELECTROSURGICAL) ×1 IMPLANT
GAUZE SPONGE 4X4 16PLY XRAY LF (GAUZE/BANDAGES/DRESSINGS) ×2 IMPLANT
GLOVE BIOGEL PI IND STRL 7.0 (GLOVE) ×1 IMPLANT
GLOVE BIOGEL PI INDICATOR 7.0 (GLOVE) ×1
GLOVE ECLIPSE 8.0 STRL XLNG CF (GLOVE) ×1 IMPLANT
GLOVE INDICATOR 8.0 STRL GRN (GLOVE) ×2 IMPLANT
GLOVE SURG SS PI 6.5 STRL IVOR (GLOVE) ×2 IMPLANT
GOWN PREVENTION PLUS XXLARGE (GOWN DISPOSABLE) ×2 IMPLANT
GOWN STRL REIN XL XLG (GOWN DISPOSABLE) ×4 IMPLANT
HEMOSTAT SURGICEL 4X8 (HEMOSTASIS) ×1 IMPLANT
KIT SLIDE ONE PROLAPS HEMORR (KITS) ×2 IMPLANT
LUBRICANT JELLY K Y 4OZ (MISCELLANEOUS) ×2 IMPLANT
NDL SAFETY ECLIPSE 18X1.5 (NEEDLE) ×1 IMPLANT
NEEDLE HYPO 18GX1.5 SHARP (NEEDLE)
NEEDLE HYPO 22GX1.5 SAFETY (NEEDLE) ×2 IMPLANT
NS IRRIG 1000ML POUR BTL (IV SOLUTION) ×2 IMPLANT
PACK LITHOTOMY IV (CUSTOM PROCEDURE TRAY) ×1 IMPLANT
PENCIL BUTTON HOLSTER BLD 10FT (ELECTRODE) ×2 IMPLANT
SPONGE GAUZE 4X4 12PLY (GAUZE/BANDAGES/DRESSINGS) ×1 IMPLANT
SPONGE HEMORRHOID 8X3CM (HEMOSTASIS) ×1 IMPLANT
SPONGE SURGIFOAM ABS GEL 100 (HEMOSTASIS) ×1 IMPLANT
SUT CHROMIC 2 0 SH (SUTURE) IMPLANT
SUT CHROMIC 3 0 SH 27 (SUTURE) IMPLANT
SUT VIC AB 2-0 UR6 27 (SUTURE) IMPLANT
SYR 20CC LL (SYRINGE) ×2 IMPLANT
TOWEL OR 17X26 10 PK STRL BLUE (TOWEL DISPOSABLE) ×2 IMPLANT
TOWEL OR NON WOVEN STRL DISP B (DISPOSABLE) ×1 IMPLANT

## 2012-07-22 NOTE — H&P (View-Only) (Signed)
Chief Complaint  Patient presents with  . New Evaluation    hems    HISTORY: Tyler Taylor is a 56 y.o. male who presents to the office with rectal bleedings.   He has tried OTC and injections in the past with some success.  BM's makes the symptoms worse.  This had been occurring for several years.  He has a long standing history of constipation but has been taking fiber for the last 3 years with good results.  It is occurs with every BM and has been like that for at least 5 yrs.  His bowel habits are regular and his bowel movements are soft.  His fiber intake is good.  His last colonoscopy was 3 yrs ago by Dr Stark. He injected his hemorrhoids at that time.  He denies any prolapsing tissue. He denies any itching or pain.  He is not on any blood thinners.     Past Medical History  Diagnosis Date  . GERD (gastroesophageal reflux disease)   . Hiatal hernia   . Tension pneumothorax 2002    from diving  . Hemorrhoids   . Seasonal allergic rhinitis   . Pneumonia April 2013      Past Surgical History  Procedure Date  . Appendectomy   . Tonsillectomy   . Ganglion cyst excision     left wrist        Current Outpatient Prescriptions  Medication Sig Dispense Refill  . cefUROXime (CEFTIN) 250 MG tablet Take 250 mg by mouth 2 (two) times daily.      . loratadine (CLARITIN) 10 MG tablet Take 10 mg by mouth daily.      . metroNIDAZOLE (FLAGYL) 500 MG tablet Take 500 mg by mouth once.      . mometasone (NASONEX) 50 MCG/ACT nasal spray Place 2 sprays into the nose daily. On hold since dx w/ pna      . Olopatadine HCl (PATANASE) 0.6 % SOLN Place 2 puffs into the nose daily.      . omeprazole (PRILOSEC) 40 MG capsule Take 40 mg by mouth daily.      . pravastatin (PRAVACHOL) 40 MG tablet Take 40 mg by mouth daily.      . pseudoephedrine-guaifenesin (MUCINEX D) 60-600 MG per tablet Take 1 tablet by mouth daily.      . mesalamine (CANASA) 1000 MG suppository Place 1 suppository (1,000 mg total)  rectally at bedtime.  30 suppository  1      No Known Allergies    No family history on file.  History   Social History  . Marital Status: Married    Spouse Name: N/A    Number of Children: 2  . Years of Education: N/A   Occupational History  . vetenarian    Social History Main Topics  . Smoking status: Never Smoker   . Smokeless tobacco: Never Used  . Alcohol Use: No  . Drug Use: No  . Sexually Active: None   Other Topics Concern  . None   Social History Narrative   About 6 cokes      REVIEW OF SYSTEMS - PERTINENT POSITIVES ONLY: Review of Systems - General ROS: negative Respiratory ROS: no cough, shortness of breath, or wheezing Cardiovascular ROS: no chest pain or dyspnea on exertion Gastrointestinal ROS: no abdominal pain, change in bowel habits, or black or bloody stools Genito-Urinary ROS: no dysuria, trouble voiding, or hematuria  EXAM: Filed Vitals:   07/18/12 1529  BP: 132/82  Pulse:   68  Temp: 97.8 F (36.6 C)  Resp: 18    General appearance: alert and cooperative Resp: clear to auscultation bilaterally Cardio: regular rate and rhythm GI: soft, non-tender; bowel sounds normal; no masses,  no organomegaly   Procedure: Anoscopy Surgeon: Jontez Redfield Diagnosis: rectal bleeding  Assistant: Glaspey After the risks and benefits were explained, verbal consent was obtained for above procedure  Anesthesia: none Findings: grade 2 bleeding left lateral and RA hemorrhoids, bleeding at dentate line      ASSESSMENT AND PLAN: Tyler Taylor is a 56 y.o. M who has failed medical management of his bleeding internal hemorrhoids.  We discussed treatment options that included injecting his hemorrhoids again, banding his hemorrhoids, stapling is hemorrhoids, THD and surgical resection. I do not think that he would be a great banding candidate because the area that the bleeding is very close to the dentate line. We discussed PPH and the complications they're associated  with this. I recommended that he undergo THD.  We discussed the risk and benefits of this procedure. We discussed that he will most likely have some pelvic discomfort pressure and pain for approximately a week. This should get better with time. We discussed the importance of not getting constipated after the surgery. We discussed the recurrence rate of about 20%. We discussed ways that he can decrease this postoperatively. He would like to proceed with surgery.    Tyler Passarelli C Jalina Blowers, MD Colon and Rectal Surgery / General Surgery [facility]      Visit Diagnoses: 1. Rectal bleeding     Primary Care Physician: No primary provider on file.   

## 2012-07-22 NOTE — Op Note (Signed)
07/22/2012  2:29 PM  PATIENT:  Tyler Taylor  56 y.o. male  Patient Care Team: Meryl Dare, MD,FACG as Referring Physician (Gastroenterology)  PRE-OPERATIVE DIAGNOSIS:  Bleeding internal hemorrhoids  POST-OPERATIVE DIAGNOSIS:  Bleeding internal hemorrhoids  PROCEDURE:  Procedure(s): TRANSANAL HEMORRHOIDAL DEARTERIALIZATION  SURGEON:  Surgeon(s): Romie Levee, MD  ASSISTANT: none   ANESTHESIA:   local and general  EBL:  Total I/O In: 1000 [I.V.:1000] Out: 50 [Blood:50]  Delay start of Pharmacological VTE agent (>24hrs) due to surgical blood loss or risk of bleeding:  not applicable  DRAINS: none   SPECIMEN:  No Specimen  DISPOSITION OF SPECIMEN:  N/A  COUNTS:  YES  PLAN OF CARE: Discharge to home after PACU  PATIENT DISPOSITION:  PACU - hemodynamically stable.  INDICATION: bleeding internal hemorrhoids  OR FINDINGS: grade 4 internal hemorrhoids  DESCRIPTION:   Patient underwent general anesthesia without difficulty. Patient was placed into prone positioning.  The perianal region was prepped and draped in sterile fashion. Surgical tunnel confirmed or plan.  I performed a rectal block with 0.25% lidocaine.    I did digital rectal examination and then transitioned over to anoscopy to get a sense of the anatomy.  I switched over to the [facility] fiberoptically lit Doppler anocope.   Using the Doppler on the tip of the THD anoscope, I identified the arterial hemorrhoidal vessels coming in in the classic hexagonal anatomical pattern (right posterior/lateral/anterior, left posterior /lateral/anterior).    I proceeded to ligate the hemorrhoidal arteries. I used a 2-0 Vicryl suture on a UR-6 needle in a figure-of-eight fashion over the signal around 6 cm proximal to the anal verge. I then ran that stitch longitudinally more distally to the white line of [place]. I then tied that stitch down to cause a hemorrhoidopexy. I did that for all 6 locations.    I redid Doppler  anoscopy. I Identified a signal at the right and left anterior location.  I isolated and ligated these with separate figure-of-eight stitches. Signals went away.  At completion of this, all hemorrhoids were reduced into the rectum.  There is no more prolapse. External anatomy still had some mild skin tags posteriorly.  The anterior tags had reduced.  I repeated anoscopy and examination. I infused the anal region with 20ml of Experel.  Hemostasis was good. I placed a soft Gelfoam cylinder into the rectum. Patient is being extubated go to recovery room.  I am about to discuss the patient's status to the family.

## 2012-07-22 NOTE — Interval H&P Note (Signed)
History and Physical Interval Note:  07/22/2012 1:12 PM  Tyler Taylor  has presented today for surgery, with the diagnosis of internal hemorrhoids  The various methods of treatment have been discussed with the patient and family. After consideration of risks, benefits and other options for treatment, the patient has consented to  Procedure(s) (LRB) with comments: TRANSANAL HEMORRHOIDAL DEARTERIALIZATION (N/A) as a surgical intervention .  The patient's history has been reviewed, patient examined, no change in status, stable for surgery.  I have reviewed the patient's chart and labs.  Questions were answered to the patient's satisfaction.     Vanita Panda, MD  Colorectal and General Surgery [facility]

## 2012-07-22 NOTE — Transfer of Care (Signed)
Immediate Anesthesia Transfer of Care Note  Patient: Tyler Taylor  Procedure(s) Performed: Procedure(s) (LRB) with comments: TRANSANAL HEMORRHOIDAL DEARTERIALIZATION (N/A)  Patient Location: PACU  Anesthesia Type:General  Level of Consciousness: awake, alert , oriented and patient cooperative  Airway & Oxygen Therapy: Patient Spontanous Breathing and Patient connected to face mask oxygen  Post-op Assessment: Report given to PACU RN, Post -op Vital signs reviewed and stable and Patient moving all extremities X 4  Post vital signs: stable  Complications: No apparent anesthesia complications

## 2012-07-22 NOTE — Anesthesia Preprocedure Evaluation (Signed)
Anesthesia Evaluation  Patient identified by MRN, date of birth, ID band Patient awake    Reviewed: Allergy & Precautions, H&P , NPO status , Patient's Chart, lab work & pertinent test results  History of Anesthesia Complications (+) PONV  Airway Mallampati: II TM Distance: >3 FB Neck ROM: Full    Dental No notable dental hx.    Pulmonary neg pulmonary ROS,  breath sounds clear to auscultation  Pulmonary exam normal       Cardiovascular negative cardio ROS  Rhythm:Regular Rate:Normal     Neuro/Psych negative neurological ROS  negative psych ROS   GI/Hepatic negative GI ROS, Neg liver ROS, hiatal hernia,   Endo/Other  negative endocrine ROS  Renal/GU negative Renal ROS  negative genitourinary   Musculoskeletal negative musculoskeletal ROS (+)   Abdominal   Peds negative pediatric ROS (+)  Hematology negative hematology ROS (+)   Anesthesia Other Findings   Reproductive/Obstetrics negative OB ROS                           Anesthesia Physical Anesthesia Plan  ASA: II  Anesthesia Plan: General   Post-op Pain Management:    Induction: Intravenous  Airway Management Planned: Oral ETT  Additional Equipment:   Intra-op Plan:   Post-operative Plan: Extubation in OR  Informed Consent: I have reviewed the patients History and Physical, chart, labs and discussed the procedure including the risks, benefits and alternatives for the proposed anesthesia with the patient or authorized representative who has indicated his/her understanding and acceptance.   Dental advisory given  Plan Discussed with: CRNA  Anesthesia Plan Comments: (Severe PONV with all previous anesthetics using inhallational agents. Will do TIVA today.)        Anesthesia Quick Evaluation

## 2012-07-25 ENCOUNTER — Encounter (HOSPITAL_COMMUNITY): Payer: Self-pay | Admitting: General Surgery

## 2012-07-25 NOTE — Anesthesia Postprocedure Evaluation (Signed)
  Anesthesia Post-op Note  Patient: Tyler Taylor  Procedure(s) Performed: Procedure(s) (LRB): TRANSANAL HEMORRHOIDAL DEARTERIALIZATION (N/A)  Patient Location: PACU  Anesthesia Type: General  Level of Consciousness: awake and alert   Airway and Oxygen Therapy: Patient Spontanous Breathing  Post-op Pain: mild  Post-op Assessment: Post-op Vital signs reviewed, Patient's Cardiovascular Status Stable, Respiratory Function Stable, Patent Airway and No signs of Nausea or vomiting  Last Vitals:  Filed Vitals:   07/22/12 1646  BP: 147/82  Pulse: 70  Temp: 36.3 C  Resp: 18    Post-op Vital Signs: stable   Complications: No apparent anesthesia complications

## 2012-07-29 ENCOUNTER — Encounter (HOSPITAL_COMMUNITY): Payer: Self-pay | Admitting: *Deleted

## 2012-08-05 ENCOUNTER — Encounter (INDEPENDENT_AMBULATORY_CARE_PROVIDER_SITE_OTHER): Payer: Self-pay | Admitting: General Surgery

## 2012-08-05 ENCOUNTER — Ambulatory Visit (INDEPENDENT_AMBULATORY_CARE_PROVIDER_SITE_OTHER): Payer: Private Health Insurance - Indemnity | Admitting: General Surgery

## 2012-08-05 VITALS — BP 142/90 | HR 84 | Temp 98.0°F | Wt 227.4 lb

## 2012-08-05 DIAGNOSIS — K648 Other hemorrhoids: Secondary | ICD-10-CM

## 2012-08-05 NOTE — Progress Notes (Signed)
Tyler Taylor is a 57 y.o. male who is status post a THD on 12/27.  He is doing well.  He had 3-4 days of pain and then came off his narcotics.  He is having regular soft BM's.  His bleeding has stopped.  He is still having some tissue sloughing.    Objective: Filed Vitals:   08/05/12 1635  BP: 142/90  Pulse: 84  Temp: 98 F (36.7 C)    General appearance: alert, cooperative and no distress  Anal exam: deferred per pt preference   Assessment: s/p  Patient Active Problem List  Diagnosis  . Pneumonia, organism unspecified  . Allergic rhinitis    Plan: Doing well s/p THD.  RTO PRN.      Marland KitchenVanita Panda, MD [facility] [place] [phone]   08/05/2012 4:43 PM

## 2012-08-05 NOTE — Patient Instructions (Signed)
Continue a high fiber diet indefinitely.  Continue your stool softeners for 3 months and afterwards as needed for any hard stools.

## 2012-09-10 ENCOUNTER — Other Ambulatory Visit: Payer: Self-pay

## 2013-06-01 ENCOUNTER — Other Ambulatory Visit: Payer: Self-pay

## 2014-05-11 ENCOUNTER — Other Ambulatory Visit: Payer: Self-pay

## 2014-11-26 ENCOUNTER — Encounter: Payer: Self-pay | Admitting: Gastroenterology

## 2015-04-06 DIAGNOSIS — H1010 Acute atopic conjunctivitis, unspecified eye: Secondary | ICD-10-CM | POA: Insufficient documentation

## 2015-04-06 DIAGNOSIS — K219 Gastro-esophageal reflux disease without esophagitis: Secondary | ICD-10-CM | POA: Insufficient documentation

## 2015-04-06 DIAGNOSIS — J309 Allergic rhinitis, unspecified: Secondary | ICD-10-CM | POA: Insufficient documentation

## 2015-05-17 ENCOUNTER — Ambulatory Visit (INDEPENDENT_AMBULATORY_CARE_PROVIDER_SITE_OTHER): Payer: Managed Care, Other (non HMO) | Admitting: Neurology

## 2015-05-17 DIAGNOSIS — J309 Allergic rhinitis, unspecified: Secondary | ICD-10-CM | POA: Diagnosis not present

## 2015-06-03 ENCOUNTER — Ambulatory Visit (INDEPENDENT_AMBULATORY_CARE_PROVIDER_SITE_OTHER): Payer: Managed Care, Other (non HMO)

## 2015-06-03 DIAGNOSIS — J309 Allergic rhinitis, unspecified: Secondary | ICD-10-CM

## 2015-06-17 ENCOUNTER — Ambulatory Visit (INDEPENDENT_AMBULATORY_CARE_PROVIDER_SITE_OTHER): Payer: Managed Care, Other (non HMO) | Admitting: *Deleted

## 2015-06-17 DIAGNOSIS — J309 Allergic rhinitis, unspecified: Secondary | ICD-10-CM | POA: Diagnosis not present

## 2015-06-27 ENCOUNTER — Ambulatory Visit (INDEPENDENT_AMBULATORY_CARE_PROVIDER_SITE_OTHER): Payer: Managed Care, Other (non HMO)

## 2015-06-27 DIAGNOSIS — J309 Allergic rhinitis, unspecified: Secondary | ICD-10-CM

## 2015-07-08 ENCOUNTER — Ambulatory Visit (INDEPENDENT_AMBULATORY_CARE_PROVIDER_SITE_OTHER): Payer: Managed Care, Other (non HMO) | Admitting: Neurology

## 2015-07-08 DIAGNOSIS — J309 Allergic rhinitis, unspecified: Secondary | ICD-10-CM | POA: Diagnosis not present

## 2015-07-10 ENCOUNTER — Other Ambulatory Visit: Payer: Self-pay | Admitting: Allergy and Immunology

## 2015-07-17 ENCOUNTER — Ambulatory Visit (INDEPENDENT_AMBULATORY_CARE_PROVIDER_SITE_OTHER): Payer: Managed Care, Other (non HMO)

## 2015-07-17 DIAGNOSIS — J309 Allergic rhinitis, unspecified: Secondary | ICD-10-CM

## 2015-07-30 ENCOUNTER — Ambulatory Visit (INDEPENDENT_AMBULATORY_CARE_PROVIDER_SITE_OTHER): Payer: Managed Care, Other (non HMO)

## 2015-07-30 DIAGNOSIS — J309 Allergic rhinitis, unspecified: Secondary | ICD-10-CM

## 2015-08-12 ENCOUNTER — Ambulatory Visit (INDEPENDENT_AMBULATORY_CARE_PROVIDER_SITE_OTHER): Payer: Managed Care, Other (non HMO)

## 2015-08-12 DIAGNOSIS — J309 Allergic rhinitis, unspecified: Secondary | ICD-10-CM | POA: Diagnosis not present

## 2015-09-06 ENCOUNTER — Ambulatory Visit (INDEPENDENT_AMBULATORY_CARE_PROVIDER_SITE_OTHER): Payer: Managed Care, Other (non HMO) | Admitting: *Deleted

## 2015-09-06 DIAGNOSIS — J309 Allergic rhinitis, unspecified: Secondary | ICD-10-CM | POA: Diagnosis not present

## 2015-09-25 HISTORY — PX: SHOULDER SURGERY: SHX246

## 2015-10-11 ENCOUNTER — Ambulatory Visit (INDEPENDENT_AMBULATORY_CARE_PROVIDER_SITE_OTHER): Payer: Managed Care, Other (non HMO) | Admitting: *Deleted

## 2015-10-11 DIAGNOSIS — J309 Allergic rhinitis, unspecified: Secondary | ICD-10-CM

## 2015-10-17 DIAGNOSIS — J301 Allergic rhinitis due to pollen: Secondary | ICD-10-CM | POA: Diagnosis not present

## 2015-10-18 DIAGNOSIS — J3089 Other allergic rhinitis: Secondary | ICD-10-CM | POA: Diagnosis not present

## 2015-10-29 ENCOUNTER — Ambulatory Visit (INDEPENDENT_AMBULATORY_CARE_PROVIDER_SITE_OTHER): Payer: Managed Care, Other (non HMO)

## 2015-10-29 ENCOUNTER — Other Ambulatory Visit: Payer: Self-pay

## 2015-10-29 DIAGNOSIS — J309 Allergic rhinitis, unspecified: Secondary | ICD-10-CM | POA: Diagnosis not present

## 2015-10-29 MED ORDER — EPINEPHRINE 0.3 MG/0.3ML IJ SOAJ
0.3000 mg | Freq: Once | INTRAMUSCULAR | Status: DC
Start: 2015-10-29 — End: 2017-03-02

## 2015-11-06 ENCOUNTER — Encounter: Payer: Self-pay | Admitting: Allergy and Immunology

## 2015-11-06 ENCOUNTER — Ambulatory Visit (INDEPENDENT_AMBULATORY_CARE_PROVIDER_SITE_OTHER): Payer: Managed Care, Other (non HMO) | Admitting: Allergy and Immunology

## 2015-11-06 VITALS — BP 130/84 | HR 84 | Resp 18

## 2015-11-06 DIAGNOSIS — B54 Unspecified malaria: Secondary | ICD-10-CM

## 2015-11-06 DIAGNOSIS — J309 Allergic rhinitis, unspecified: Secondary | ICD-10-CM | POA: Diagnosis not present

## 2015-11-06 DIAGNOSIS — H1010 Acute atopic conjunctivitis, unspecified eye: Secondary | ICD-10-CM | POA: Diagnosis not present

## 2015-11-06 DIAGNOSIS — J387 Other diseases of larynx: Secondary | ICD-10-CM

## 2015-11-06 DIAGNOSIS — K219 Gastro-esophageal reflux disease without esophagitis: Secondary | ICD-10-CM

## 2015-11-06 MED ORDER — CHLOROQUINE PHOSPHATE 500 MG PO TABS: ORAL_TABLET | ORAL | Status: DC

## 2015-11-06 NOTE — Progress Notes (Signed)
Follow-up Note  Referring Provider: No ref. provider found Primary Provider: No PCP Per Patient Date of Office Visit: 11/06/2015  Subjective:   Tyler BoatmanRandy C Taylor (DOB: 02/08/1956) is a 60 y.o. male who [facility] in re-evaluation of the following:  HPI Comments: Tyler HeckRandy returns to this clinic in evaluation of his allergic rhinoconjunctivitis and LPR. Currently his allergic rhinoconjunctivitis is being treated with immunotherapy and Dymista. He is presently receiving his immunotherapy every 4 weeks. He does have some difficulty during buildup of a new vial and he's requesting if we can shorten the duration of that buildup procedure. His reflux is under excellent control while using omeprazole and ranitidine. He is requesting malaria prophylaxis with a prescription for chloroquine     Medication List           docusate sodium 100 MG capsule  Commonly known as:  COLACE  Take 1 capsule (100 mg total) by mouth 2 (two) times daily.     DYMISTA 137-50 MCG/ACT Susp  Generic drug:  Azelastine-Fluticasone  USE 1 SPRAY IN EACH NOSTRIL TWICE A DAY FOR STUFFY NOSE OR DRAIANGE     EPINEPHrine 0.3 mg/0.3 mL Soaj injection  Commonly known as:  EPIPEN 2-PAK  Inject 0.3 mLs (0.3 mg total) into the muscle once.     Fiber 625 MG Tabs  Take 2 tablets by mouth daily.     loratadine 10 MG tablet  Commonly known as:  CLARITIN  Take 10 mg by mouth daily.     methocarbamol 500 MG tablet  Commonly known as:  ROBAXIN  TAKE 1 TABLET BY MOUTH EVERY 6 HOURS AS NEEDED FOR MUSCLE SPASM     metroNIDAZOLE 500 MG tablet  Commonly known as:  FLAGYL  Take 500 mg by mouth daily. Reported on 11/06/2015     multivitamin with minerals Tabs tablet  Take 1 tablet by mouth daily.     omeprazole 40 MG capsule  Commonly known as:  PRILOSEC  Take 40 mg by mouth daily.     pravastatin 40 MG tablet  Commonly known as:  PRAVACHOL  Take 40 mg by mouth at bedtime.     pseudoephedrine-guaifenesin 60-600 MG 12 hr tablet  Commonly known as:  MUCINEX D  Take 0.5 tablets by mouth daily.     ranitidine 300 MG capsule  Commonly known as:  ZANTAC  Take 300 mg by mouth every evening.        Past Medical History  Diagnosis Date  . GERD (gastroesophageal reflux disease)   . Hiatal hernia   . Tension pneumothorax 2002    from diving  . Hemorrhoids   . Seasonal allergic rhinitis   . Pneumonia April 2013    Past Surgical History  Procedure Laterality Date  . Appendectomy    . Tonsillectomy    . Ganglion cyst excision      left wrist  . Transanal hemorrhoidal dearterialization  07/22/2012    Procedure: TRANSANAL HEMORRHOIDAL DEARTERIALIZATION;  Surgeon: Romie LeveeAlicia Thomas, MD;  Location: [facility];  Service: General;  Laterality: N/A;    No Known Allergies  Review of systems negative except as noted in HPI / PMHx or noted below:  Review of Systems  Constitutional: Negative.   HENT: Negative.   Eyes: Negative.   Respiratory: Negative.   Cardiovascular: Negative.   Gastrointestinal: Negative.   Genitourinary: Negative.   Musculoskeletal: Negative.   Skin: Negative.   Neurological: Negative.   Endo/Heme/Allergies:  Negative.   Psychiatric/Behavioral: Negative.      Objective:   Filed Vitals:   11/06/15 1139  BP: 130/84  Pulse: 84  Resp: 18          Physical Exam  Constitutional: He is well-developed, well-nourished, and in no distress.  HENT:  Head: Normocephalic.  Right Ear: Tympanic membrane, external ear and ear canal normal.  Left Ear: Tympanic membrane, external ear and ear canal normal.  Nose: Nose normal. No mucosal edema or rhinorrhea.  Mouth/Throat: Uvula is midline, oropharynx is clear and moist and mucous membranes are normal. No oropharyngeal exudate.  Eyes: Conjunctivae are normal.  Neck: Trachea normal. No tracheal tenderness present. No tracheal deviation present. No thyromegaly present.  Cardiovascular: Normal rate,  regular rhythm, S1 normal, S2 normal and normal heart sounds.   No murmur heard. Pulmonary/Chest: Breath sounds normal. No stridor. No respiratory distress. He has no wheezes. He has no rales.  Musculoskeletal: He exhibits no edema.  Lymphadenopathy:       Head (right side): No tonsillar adenopathy present.       Head (left side): No tonsillar adenopathy present.    He has no cervical adenopathy.  Neurological: He is alert. Gait normal.  Skin: No rash noted. He is not diaphoretic. No erythema. Nails show no clubbing.  Psychiatric: Mood and affect normal.    Diagnostics: None     Assessment and Plan:   1. Allergic rhinoconjunctivitis   2. LPRD (laryngopharyngeal reflux disease)   3. Malaria prohylaxis    1. Continue immunotherapy and EpiPen  2. During ITX buildup with a new vial utilize 0.1 mL injection followed by 0.5 mL injection one week later  3. Continue Dymista one spray each nostril twice a day  4. Continue omeprazole 40 mg in a.m. plus ranitidine 300 mg in PM  5. Chloroquine 500 mg one tablet once a week starting 2 weeks before exposure and continuing for 4 weeks after exposure  6. Return to clinic in 1 year or earlier if problem  Tyler Taylor will utilize the therapy mentioned above and we'll see him back in this clinic in approximately one year or earlier if there is a problem. I did give him chloroquine for malaria prophylaxis when he goes to [place] this May. He'll contact me should there be any significant problems during the interval. We will make an attempt to consolidate his buildup procedure as noted above.  Laurette Schimke, MD [facility]

## 2015-11-06 NOTE — Patient Instructions (Addendum)
  1. Continue immunotherapy and EpiPen  2. During ITX buildup with a new vial utilize 0.1 mL injection followed by 0.5 mL injection one week later  3. Continue Dymista one spray each nostril twice a day  4. Continue omeprazole 40 mg in a.m. plus ranitidine 300 mg in PM  5. Chloroquine 500 mg one tablet once a week starting 2 weeks before exposure and continuing for 4 weeks after exposure  6. Return to clinic in 1 year or earlier if problem

## 2015-12-02 ENCOUNTER — Ambulatory Visit (INDEPENDENT_AMBULATORY_CARE_PROVIDER_SITE_OTHER): Payer: Managed Care, Other (non HMO) | Admitting: *Deleted

## 2015-12-02 DIAGNOSIS — J309 Allergic rhinitis, unspecified: Secondary | ICD-10-CM | POA: Diagnosis not present

## 2015-12-30 DIAGNOSIS — J301 Allergic rhinitis due to pollen: Secondary | ICD-10-CM | POA: Diagnosis not present

## 2015-12-31 DIAGNOSIS — J3089 Other allergic rhinitis: Secondary | ICD-10-CM | POA: Diagnosis not present

## 2016-01-01 DIAGNOSIS — J3081 Allergic rhinitis due to animal (cat) (dog) hair and dander: Secondary | ICD-10-CM

## 2016-01-06 ENCOUNTER — Ambulatory Visit (INDEPENDENT_AMBULATORY_CARE_PROVIDER_SITE_OTHER): Payer: Managed Care, Other (non HMO)

## 2016-01-06 DIAGNOSIS — J309 Allergic rhinitis, unspecified: Secondary | ICD-10-CM | POA: Diagnosis not present

## 2016-02-03 ENCOUNTER — Ambulatory Visit (INDEPENDENT_AMBULATORY_CARE_PROVIDER_SITE_OTHER): Payer: Managed Care, Other (non HMO) | Admitting: *Deleted

## 2016-02-03 DIAGNOSIS — J309 Allergic rhinitis, unspecified: Secondary | ICD-10-CM | POA: Diagnosis not present

## 2016-02-10 ENCOUNTER — Ambulatory Visit (INDEPENDENT_AMBULATORY_CARE_PROVIDER_SITE_OTHER): Payer: Managed Care, Other (non HMO) | Admitting: *Deleted

## 2016-02-10 DIAGNOSIS — J309 Allergic rhinitis, unspecified: Secondary | ICD-10-CM

## 2016-03-06 ENCOUNTER — Ambulatory Visit (INDEPENDENT_AMBULATORY_CARE_PROVIDER_SITE_OTHER): Payer: Managed Care, Other (non HMO) | Admitting: *Deleted

## 2016-03-06 DIAGNOSIS — J309 Allergic rhinitis, unspecified: Secondary | ICD-10-CM

## 2016-04-06 ENCOUNTER — Ambulatory Visit (INDEPENDENT_AMBULATORY_CARE_PROVIDER_SITE_OTHER): Payer: Managed Care, Other (non HMO) | Admitting: *Deleted

## 2016-04-06 DIAGNOSIS — J309 Allergic rhinitis, unspecified: Secondary | ICD-10-CM | POA: Diagnosis not present

## 2016-04-07 ENCOUNTER — Other Ambulatory Visit: Payer: Self-pay | Admitting: Allergy and Immunology

## 2016-05-05 ENCOUNTER — Ambulatory Visit (INDEPENDENT_AMBULATORY_CARE_PROVIDER_SITE_OTHER): Payer: Managed Care, Other (non HMO) | Admitting: *Deleted

## 2016-05-05 DIAGNOSIS — H1010 Acute atopic conjunctivitis, unspecified eye: Secondary | ICD-10-CM | POA: Diagnosis not present

## 2016-05-05 DIAGNOSIS — J309 Allergic rhinitis, unspecified: Secondary | ICD-10-CM | POA: Diagnosis not present

## 2016-06-08 ENCOUNTER — Ambulatory Visit (INDEPENDENT_AMBULATORY_CARE_PROVIDER_SITE_OTHER): Payer: Managed Care, Other (non HMO) | Admitting: *Deleted

## 2016-06-08 DIAGNOSIS — J309 Allergic rhinitis, unspecified: Secondary | ICD-10-CM | POA: Diagnosis not present

## 2016-06-10 ENCOUNTER — Ambulatory Visit: Payer: Self-pay | Admitting: *Deleted

## 2016-07-13 ENCOUNTER — Ambulatory Visit (INDEPENDENT_AMBULATORY_CARE_PROVIDER_SITE_OTHER): Payer: Managed Care, Other (non HMO) | Admitting: *Deleted

## 2016-07-13 DIAGNOSIS — J309 Allergic rhinitis, unspecified: Secondary | ICD-10-CM | POA: Diagnosis not present

## 2016-07-31 ENCOUNTER — Ambulatory Visit (INDEPENDENT_AMBULATORY_CARE_PROVIDER_SITE_OTHER): Payer: Managed Care, Other (non HMO)

## 2016-07-31 DIAGNOSIS — J309 Allergic rhinitis, unspecified: Secondary | ICD-10-CM

## 2016-08-31 NOTE — Addendum Note (Signed)
Addended by: Berna BueWHITAKER, CARRIE L on: 08/31/2016 10:56 AM   Modules accepted: Orders

## 2016-09-07 ENCOUNTER — Ambulatory Visit (INDEPENDENT_AMBULATORY_CARE_PROVIDER_SITE_OTHER): Payer: Managed Care, Other (non HMO)

## 2016-09-07 DIAGNOSIS — J309 Allergic rhinitis, unspecified: Secondary | ICD-10-CM | POA: Diagnosis not present

## 2016-09-11 ENCOUNTER — Ambulatory Visit (INDEPENDENT_AMBULATORY_CARE_PROVIDER_SITE_OTHER): Payer: Managed Care, Other (non HMO) | Admitting: Internal Medicine

## 2016-09-11 ENCOUNTER — Ambulatory Visit (INDEPENDENT_AMBULATORY_CARE_PROVIDER_SITE_OTHER)
Admission: RE | Admit: 2016-09-11 | Discharge: 2016-09-11 | Disposition: A | Discharge status: Home / Self Care | Payer: Managed Care, Other (non HMO) | Source: Ambulatory Visit | Attending: Internal Medicine | Admitting: Internal Medicine

## 2016-09-11 ENCOUNTER — Telehealth: Payer: Self-pay | Admitting: Internal Medicine

## 2016-09-11 ENCOUNTER — Ambulatory Visit (INDEPENDENT_AMBULATORY_CARE_PROVIDER_SITE_OTHER): Payer: Managed Care, Other (non HMO)

## 2016-09-11 ENCOUNTER — Encounter: Payer: Self-pay | Admitting: Internal Medicine

## 2016-09-11 VITALS — BP 142/90 | HR 92 | Ht 73.0 in | Wt 228.0 lb

## 2016-09-11 DIAGNOSIS — Z7189 Other specified counseling: Secondary | ICD-10-CM

## 2016-09-11 DIAGNOSIS — I10 Essential (primary) hypertension: Secondary | ICD-10-CM | POA: Diagnosis not present

## 2016-09-11 DIAGNOSIS — J309 Allergic rhinitis, unspecified: Secondary | ICD-10-CM

## 2016-09-11 DIAGNOSIS — E785 Hyperlipidemia, unspecified: Secondary | ICD-10-CM | POA: Diagnosis not present

## 2016-09-11 DIAGNOSIS — R3911 Hesitancy of micturition: Secondary | ICD-10-CM

## 2016-09-11 DIAGNOSIS — R918 Other nonspecific abnormal finding of lung field: Secondary | ICD-10-CM

## 2016-09-11 NOTE — Progress Notes (Signed)
Cardiology Office Note   Date:  09/11/2016   ID:  Tyler Taylor, DOB Aug 25, 1955, MRN [id number]  [facility]  Cardiologist:   Dietrich PatesPaula Twylla Arceneaux, MD    Pt presents  Referred for HTN and HL     History of Present Illness: Tyler Taylor is a 61 y.o. 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  Had BP checked one month ago   160/  He was taking decongestant at the time       Current Meds  Medication Sig  . docusate sodium (COLACE) 100 MG capsule Take 1 capsule (100 mg total) by mouth 2 (two) times daily.  Marland Kitchen. DYMISTA 137-50 MCG/ACT SUSP USE 1 SPRAY IN EACH NOSTRIL TWICE A DAY FOR STUFFY NOSE OR DRAIANGE  . EPINEPHrine (EPIPEN 2-PAK) 0.3 mg/0.3 mL IJ SOAJ injection Inject 0.3 mLs (0.3 mg total) into the muscle once.  . loratadine (CLARITIN) 10 MG tablet Take 10 mg by mouth daily.  . Multiple Vitamin (MULTIVITAMIN WITH MINERALS) TABS Take 1 tablet by mouth daily.  Marland Kitchen. omeprazole (PRILOSEC) 40 MG capsule Take 40 mg by mouth daily.  . pravastatin (PRAVACHOL) 40 MG tablet Take 40 mg by mouth at bedtime.   . pseudoephedrine-guaifenesin (MUCINEX D) 60-600 MG per tablet Take 0.5 tablets by mouth daily.   . ranitidine (ZANTAC) 300 MG capsule Take 300 mg by mouth every evening.     Allergies:   Patient has no known allergies.   Past Medical History:  Diagnosis Date  . Fatigue   . GERD (gastroesophageal reflux disease)   . Hemorrhoids   . Hiatal hernia   . Hyperlipemia, mixed   . Nocturia   . Pneumonia April 2013  . Seasonal allergic rhinitis   . SOB (shortness of breath)   . Tension pneumothorax 2002   from diving    Past Surgical History:  Procedure Laterality Date  . APPENDECTOMY    .  GANGLION CYST EXCISION     left wrist  . TONSILLECTOMY    . TRANSANAL HEMORRHOIDAL DEARTERIALIZATION  07/22/2012   Procedure: TRANSANAL HEMORRHOIDAL DEARTERIALIZATION;  Surgeon: Romie LeveeAlicia Thomas, MD;  Location: [facility];  Service: General;  Laterality: N/A;     Social History:  The patient  reports that he has never smoked. He has never used smokeless tobacco. He reports that he does not drink alcohol or use drugs.   Family History:  The patient's family history includes Melanoma in his mother.    ROS:  Please see the history of present illness. All other systems are reviewed and  Negative to the above problem except as noted.    PHYSICAL EXAM: VS:  BP (!) 142/90   Pulse 92   Ht 6\' 1"  (1.854 m)   Wt 228 lb (103.4 kg)   BMI 30.08 kg/m    BP 146/96 R arm  126/86 L arm   GEN: Well nourished, well developed, in no acute distress  HEENT: normal  Neck: no JVD, carotid bruits, or masses Cardiac: RRR; no murmurs, rubs, or gallops,no edema  Respiratory:  clear to auscultation bilaterally, normal work of breathing GI:  soft, nontender, nondistended, + BS  No hepatomegaly  MS: no deformity Moving all extremities   Skin: warm and dry, no rash Neuro:  Strength and sensation are intact Psych: euthymic mood, full affect   EKG:  EKG is ordered today.  SR 92 bpm     Lipid Panel No results found for: CHOL, TRIG, HDL, CHOLHDL, VLDL, LDLCALC, LDLDIRECT    Wt Readings from Last 3 Encounters:  09/11/16 228 lb (103.4 kg)  08/05/12 227 lb 6 oz (103.1 kg)  07/22/12 226 lb 2 oz (102.6 kg)      ASSESSMENT AND PLAN:  1  Dyslipidemia  Pt has been treated for years  Last LDL was 116  Would repeat as a lipomed panel to evaluate particle number, size  What really needs to be defined is goal for treatment  WOuld recomm calcium score CT to help define this  Pt agreeable   2  HTN  BP is up today  It was high on one other check I told hiim to follow at home  He has cuff  Get calibrated  If diastolic  above 90 would rx for HTN  Drug choice will be based on lipids    F/U based on test results.    Will get CBC, BMET, lipomed , TSH and PSA     Current medicines are reviewed at length with the patient today.  The patient does not have concerns regarding medicines.  Signed, Dietrich Pates, MD  09/11/2016 12:18 PM    [facility] [address] [place], [place], [place]  [address] Phone: [phone]; Fax: [phone]

## 2016-09-11 NOTE — Telephone Encounter (Signed)
New message    Raoul PitchSierra is calling to give report results for this pt.

## 2016-09-11 NOTE — Patient Instructions (Addendum)
Your physician recommends that you continue on your current medications as directed. Please refer to the Current Medication list given to you today. Your physician recommends that you return for lab work in: today (lipid profile, bmet, tsh, psa, cbc ) Schedule a calcium score CT scan at checkout.   There is a $150 out of pocket cost for this study.  We will contact you once Dr. Tenny Crawoss reviews results.

## 2016-09-14 NOTE — Telephone Encounter (Signed)
SPOKE WITH  JANE  FROM [facility]  4 SMALL  NODULES   NOTED TO  RIGHT AND LEFT LOWER LOBES FROM CA SCORE   RADIOLOGIST  RECOMMENDS  CHEST  CT  FOR  BETTER  VIEW  WILL FORWARD TO  DR  ROSS  FOR REVIEW./CY

## 2016-09-15 LAB — BASIC METABOLIC PANEL
BUN / CREAT RATIO: 8 — AB (ref 10–24)
BUN: 11 mg/dL (ref 8–27)
CHLORIDE: 99 mmol/L (ref 96–106)
CO2: 22 mmol/L (ref 18–29)
Calcium: 9.2 mg/dL (ref 8.6–10.2)
Creatinine, Ser: 1.32 mg/dL — ABNORMAL HIGH (ref 0.76–1.27)
GFR calc Af Amer: 66 mL/min/{1.73_m2} (ref >59)
GFR calc non Af Amer: 57 mL/min/{1.73_m2} — ABNORMAL LOW (ref >59)
GLUCOSE: 98 mg/dL (ref 65–99)
POTASSIUM: 3.8 mmol/L (ref 3.5–5.2)
SODIUM: 138 mmol/L (ref 134–144)

## 2016-09-15 LAB — NMR, LIPOPROFILE
CHOLESTEROL: 162 mg/dL (ref 100–199)
HDL Cholesterol by NMR: 41 mg/dL (ref >39)
HDL Particle Number: 25.3 umol/L — ABNORMAL LOW (ref >=30.5)
LDL PARTICLE NUMBER: 1250 nmol/L — AB (ref <1000)
LDL SIZE: 20.9 nm (ref >20.5)
LDL-C: 96 mg/dL (ref 0–99)
LP-IR SCORE: 72 — AB (ref <=45)
SMALL LDL PARTICLE NUMBER: 566 nmol/L — AB (ref <=527)
TRIGLYCERIDES BY NMR: 125 mg/dL (ref 0–149)

## 2016-09-15 LAB — LIPID PANEL
Chol/HDL Ratio: 3.9 ratio units (ref 0.0–5.0)
Cholesterol, Total: 160 mg/dL (ref 100–199)
HDL: 41 mg/dL (ref >39)
LDL Calculated: 94 mg/dL (ref 0–99)
TRIGLYCERIDES: 125 mg/dL (ref 0–149)
VLDL CHOLESTEROL CAL: 25 mg/dL (ref 5–40)

## 2016-09-15 LAB — CBC
HEMOGLOBIN: 14.7 g/dL (ref 13.0–17.7)
Hematocrit: 41.4 % (ref 37.5–51.0)
MCH: 31.1 pg (ref 26.6–33.0)
MCHC: 35.5 g/dL (ref 31.5–35.7)
MCV: 88 fL (ref 79–97)
PLATELETS: 214 10*3/uL (ref 150–379)
RBC: 4.72 x10E6/uL (ref 4.14–5.80)
RDW: 13.5 % (ref 12.3–15.4)
WBC: 8.3 10*3/uL (ref 3.4–10.8)

## 2016-09-15 LAB — TSH: TSH: 1.71 u[IU]/mL (ref 0.450–4.500)

## 2016-09-15 LAB — APOLIPOPROTEIN B: APOLIPOPROTEIN B: 88 mg/dL (ref 52–135)

## 2016-09-15 LAB — APOLIPOPROTEIN A-1: APOLIPOPROTEIN A-1: 129 mg/dL (ref 101–178)

## 2016-09-15 LAB — PSA: PROSTATE SPECIFIC AG, SERUM: 1.4 ng/mL (ref 0.0–4.0)

## 2016-09-17 MED ORDER — PRAVASTATIN SODIUM 80 MG PO TABS: 80.0000 mg | ORAL_TABLET | Freq: Every evening | ORAL | 3 refills | Status: DC

## 2016-09-17 NOTE — Telephone Encounter (Signed)
Notes Recorded by Pricilla RifflePaula Ross V, MD on 09/16/2016 at 8:09 PM EST Reviewed CT findings with pt  2 small nodules seen  Calcium score 17 Recomm: Repeat noncontrast chest CT in 1 year to f/u nodules Send copy of full CT report (nodules mentioned) and labs  Notes Recorded by Pricilla RifflePaula Ross V, MD on 09/16/2016 at 8:08 PM EST Contacted pt  Reviewed labs Recomm: Increase Pravastatin to 80 mg  Call in Rx Pt should have repeat lipomed panel like this in 3 months Please set up  New Pravastatin prescription sent to pharmacy.  Lab orders placed for repeat labs. Left message for patient to call back to make this appointment.  Sent staff message to CT dept for patient to have recall for 1 year.

## 2016-10-26 ENCOUNTER — Ambulatory Visit (INDEPENDENT_AMBULATORY_CARE_PROVIDER_SITE_OTHER): Payer: Managed Care, Other (non HMO) | Admitting: *Deleted

## 2016-10-26 DIAGNOSIS — J309 Allergic rhinitis, unspecified: Secondary | ICD-10-CM | POA: Diagnosis not present

## 2016-10-29 DIAGNOSIS — J301 Allergic rhinitis due to pollen: Secondary | ICD-10-CM | POA: Diagnosis not present

## 2016-10-30 ENCOUNTER — Other Ambulatory Visit: Payer: Self-pay | Admitting: Allergy and Immunology

## 2016-11-27 ENCOUNTER — Ambulatory Visit (INDEPENDENT_AMBULATORY_CARE_PROVIDER_SITE_OTHER): Payer: Managed Care, Other (non HMO)

## 2016-11-27 DIAGNOSIS — J309 Allergic rhinitis, unspecified: Secondary | ICD-10-CM | POA: Diagnosis not present

## 2016-12-01 ENCOUNTER — Other Ambulatory Visit: Payer: Self-pay | Admitting: Allergy and Immunology

## 2016-12-15 ENCOUNTER — Other Ambulatory Visit: Payer: 59

## 2016-12-15 DIAGNOSIS — R918 Other nonspecific abnormal finding of lung field: Secondary | ICD-10-CM

## 2016-12-15 DIAGNOSIS — E785 Hyperlipidemia, unspecified: Secondary | ICD-10-CM

## 2016-12-16 LAB — NMR, LIPOPROFILE
Cholesterol: 165 mg/dL (ref 100–199)
HDL Cholesterol by NMR: 46 mg/dL (ref >39)
HDL PARTICLE NUMBER: 33.1 umol/L (ref >=30.5)
LDL Particle Number: 1122 nmol/L — ABNORMAL HIGH (ref <1000)
LDL SIZE: 20.8 nm (ref >20.5)
LDL-C: 93 mg/dL (ref 0–99)
LP-IR SCORE: 57 — AB (ref <=45)
SMALL LDL PARTICLE NUMBER: 400 nmol/L (ref <=527)
Triglycerides by NMR: 130 mg/dL (ref 0–149)

## 2016-12-16 LAB — LIPOPROTEIN A (LPA): LIPOPROTEIN (A): 9 nmol/L (ref <75)

## 2016-12-16 LAB — APOLIPOPROTEIN B: APOLIPOPROTEIN B: 86 mg/dL (ref 52–135)

## 2016-12-18 ENCOUNTER — Other Ambulatory Visit: Payer: Self-pay | Admitting: *Deleted

## 2016-12-18 MED ORDER — ROSUVASTATIN CALCIUM 20 MG PO TABS: 20.0000 mg | ORAL_TABLET | Freq: Every day | ORAL | 3 refills | Status: DC

## 2016-12-28 ENCOUNTER — Ambulatory Visit (INDEPENDENT_AMBULATORY_CARE_PROVIDER_SITE_OTHER): Payer: Managed Care, Other (non HMO) | Admitting: *Deleted

## 2016-12-28 DIAGNOSIS — J309 Allergic rhinitis, unspecified: Secondary | ICD-10-CM | POA: Diagnosis not present

## 2017-01-18 ENCOUNTER — Encounter: Payer: Self-pay | Admitting: Internal Medicine

## 2017-01-20 ENCOUNTER — Telehealth: Payer: Self-pay | Admitting: Internal Medicine

## 2017-01-20 NOTE — Telephone Encounter (Signed)
New message    Pt wife is calling about an email pt sent to Dr. Tenny Crawoss. Pt has tried crestor and has had bad side effects from it. He would like to go back on pravastatin. He does not have any of the medication. Please call pt wife.

## 2017-01-20 NOTE — Telephone Encounter (Signed)
Pt's wife sent Dr Tenny Crawoss an e-mail message on  6/25 regarding pt not able to take Crestor. Pt took the Crestor x 2 and he had muscle aches. Pt would like to go back on pravastatin ASAP because he does not have any of  this  medication. Pt was taking Pravastatin 80 mg daily.

## 2017-01-21 MED ORDER — PRAVASTATIN SODIUM 80 MG PO TABS: 80.0000 mg | ORAL_TABLET | Freq: Every evening | ORAL | 3 refills | Status: DC

## 2017-01-21 NOTE — Telephone Encounter (Signed)
Go back to pravastatin 80 mg

## 2017-01-21 NOTE — Telephone Encounter (Signed)
Called patient's wife.  New prescription sent for pravastatin 80 mg.

## 2017-02-05 ENCOUNTER — Other Ambulatory Visit: Payer: Self-pay | Admitting: Allergy and Immunology

## 2017-02-05 ENCOUNTER — Ambulatory Visit (INDEPENDENT_AMBULATORY_CARE_PROVIDER_SITE_OTHER): Payer: Managed Care, Other (non HMO)

## 2017-02-05 DIAGNOSIS — J309 Allergic rhinitis, unspecified: Secondary | ICD-10-CM | POA: Diagnosis not present

## 2017-02-10 ENCOUNTER — Telehealth: Payer: Self-pay | Admitting: Allergy and Immunology

## 2017-02-10 MED ORDER — AZELASTINE-FLUTICASONE 137-50 MCG/ACT NA SUSP: 1.0000 | Freq: Two times a day (BID) | NASAL | 0 refills | Status: DC

## 2017-02-10 NOTE — Telephone Encounter (Signed)
Script sent into pharmacy 

## 2017-02-10 NOTE — Telephone Encounter (Signed)
Pt called and made appointment for 03/02/2017 at 11.00 with dr Lucie Leatherkozlow and needs to have Dymista called in [phone].

## 2017-02-15 ENCOUNTER — Ambulatory Visit (INDEPENDENT_AMBULATORY_CARE_PROVIDER_SITE_OTHER): Payer: Managed Care, Other (non HMO)

## 2017-02-15 DIAGNOSIS — J309 Allergic rhinitis, unspecified: Secondary | ICD-10-CM | POA: Diagnosis not present

## 2017-03-02 ENCOUNTER — Ambulatory Visit (INDEPENDENT_AMBULATORY_CARE_PROVIDER_SITE_OTHER): Payer: Managed Care, Other (non HMO) | Admitting: Allergy and Immunology

## 2017-03-02 ENCOUNTER — Encounter: Payer: Self-pay | Admitting: Allergy and Immunology

## 2017-03-02 VITALS — BP 128/84 | HR 90 | Resp 18 | Ht 73.0 in | Wt 225.4 lb

## 2017-03-02 DIAGNOSIS — J3089 Other allergic rhinitis: Secondary | ICD-10-CM

## 2017-03-02 DIAGNOSIS — K219 Gastro-esophageal reflux disease without esophagitis: Secondary | ICD-10-CM | POA: Diagnosis not present

## 2017-03-02 MED ORDER — AZELASTINE-FLUTICASONE 137-50 MCG/ACT NA SUSP: 1.0000 | Freq: Two times a day (BID) | NASAL | 4 refills | Status: DC

## 2017-03-02 MED ORDER — EPINEPHRINE 0.3 MG/0.3ML IJ SOAJ: 0.3000 mg | Freq: Once | INTRAMUSCULAR | 0 refills | Status: AC

## 2017-03-02 NOTE — Progress Notes (Signed)
Follow-up Note  Referring Provider: [facility] Primary Provider: [facility] Date of Office Visit: 03/02/2017  Subjective:   Tyler Taylor (DOB: 1955/10/08) is a 61 y.o. male who [facility] in re-evaluation of the following:  HPI: Tyler Taylor returns to this clinic in evaluation of his allergic rhinitis and LPR. His last visit to this clinic was April 2017.  He is really done very well while utilizing immunotherapy and has not had an adverse effects secondary to the use of this medication. He is receiving immunotherapy every 4 weeks at this point.  He continues to use a combination of nasal fluticasone and nasal Azelastine and has not had any adverse effect from utilizing this medication.  His reflux is under control for the most part but he still has some breakthrough reflux symptoms. Sometimes at night he develops a throat clearing-like cough after he lays down. He still continues to use omeprazole and ranitidine which he thinks is working much better than his previous reflux plan. He still continues to drink caffeine throughout the day and stops around 3 PM in the afternoon. He does not really drink any alcohol or eat much chocolate.  Allergies as of 03/02/2017   No Known Allergies     Medication List      Azelastine-Fluticasone 137-50 MCG/ACT Susp Commonly known as:  DYMISTA Place 1-2 sprays into both nostrils 2 (two) times daily.   docusate sodium 100 MG capsule Commonly known as:  COLACE Take 1 capsule (100 mg total) by mouth 2 (two) times daily.   EPINEPHrine 0.3 mg/0.3 mL Soaj injection Commonly known as:  EPIPEN 2-PAK Inject 0.3 mLs (0.3 mg total) into the muscle once.   loratadine 10 MG tablet Commonly known as:  CLARITIN Take 10 mg by mouth daily.   multivitamin with minerals Tabs tablet Take 1 tablet by mouth daily.   omeprazole 40 MG capsule Commonly known as:   PRILOSEC Take 40 mg by mouth daily.   pravastatin 80 MG tablet Commonly known as:  PRAVACHOL Take 1 tablet (80 mg total) by mouth every evening.   pseudoephedrine-guaifenesin 60-600 MG 12 hr tablet Commonly known as:  MUCINEX D Take 0.5 tablets by mouth daily.   ranitidine 300 MG capsule Commonly known as:  ZANTAC Take 300 mg by mouth every evening.       Past Medical History:  Diagnosis Date  . Fatigue   . GERD (gastroesophageal reflux disease)   . Hemorrhoids   . Hiatal hernia   . Hyperlipemia, mixed   . Nocturia   . Pneumonia April 2013  . Seasonal allergic rhinitis   . SOB (shortness of breath)   . Tension pneumothorax 2002   from diving    Past Surgical History:  Procedure Laterality Date  . APPENDECTOMY    . GANGLION CYST EXCISION     left wrist  . TONSILLECTOMY    . TRANSANAL HEMORRHOIDAL DEARTERIALIZATION  07/22/2012   Procedure: TRANSANAL HEMORRHOIDAL DEARTERIALIZATION;  Surgeon: Romie Levee, MD;  Location: [facility];  Service: General;  Laterality: N/A;    Review of systems negative except as noted in HPI / PMHx or noted below:  Review of Systems  Constitutional: Negative.   HENT: Negative.   Eyes: Negative.   Respiratory: Negative.   Cardiovascular: Negative.   Gastrointestinal: Negative.   Genitourinary: Negative.   Musculoskeletal: Negative.   Skin: Negative.   Neurological: Negative.   Endo/Heme/Allergies:  Negative.   Psychiatric/Behavioral: Negative.      Objective:   Vitals:   03/02/17 1105  BP: 128/84  Pulse: 90  Resp: 18   Height: 6\' 1"  (185.4 cm)  Weight: 225 lb 6.4 oz (102.2 kg)   Physical Exam  Constitutional: He is well-developed, well-nourished, and in no distress.  HENT:  Head: Normocephalic.  Right Ear: Tympanic membrane, external ear and ear canal normal.  Left Ear: Tympanic membrane, external ear and ear canal normal.  Nose: Nose normal. No mucosal edema or rhinorrhea.  Mouth/Throat: Uvula is midline, oropharynx  is clear and moist and mucous membranes are normal. No oropharyngeal exudate.  Eyes: Conjunctivae are normal.  Neck: Trachea normal. No tracheal tenderness present. No tracheal deviation present. No thyromegaly present.  Cardiovascular: Normal rate, regular rhythm, S1 normal, S2 normal and normal heart sounds.   No murmur heard. Pulmonary/Chest: Breath sounds normal. No stridor. No respiratory distress. He has no wheezes. He has no rales.  Musculoskeletal: He exhibits no edema.  Lymphadenopathy:       Head (right side): No tonsillar adenopathy present.       Head (left side): No tonsillar adenopathy present.    He has no cervical adenopathy.  Neurological: He is alert. Gait normal.  Skin: No rash noted. He is not diaphoretic. No erythema. Nails show no clubbing.  Psychiatric: Mood and affect normal.    Diagnostics: none   Assessment and Plan:   1. Other allergic rhinitis   2. LPRD (laryngopharyngeal reflux disease)     1. Continue immunotherapy and (EpiPen) AUVI-Q 0.3  2. During ITX buildup with a new vial utilize 0.1 mL injection followed by 0.5 mL injection one week later  3. Continue Dymista one spray each nostril twice a day  4. Continue omeprazole 40 mg in a.m. plus ranitidine 300 mg in PM  5. Obtain fall flu vaccine  6. Return to clinic in 1 year or earlier if problem  Tyler HeckRandy appears to be doing very well at this point in time and we will continue him on the therapy noted above and I will see him back in this clinic in approximately one year or earlier if there is a problem.  Laurette SchimkeEric Kozlow, MD Allergy / Immunology [facility]

## 2017-03-02 NOTE — Patient Instructions (Signed)
  1. Continue immunotherapy and (EpiPen) AUVI-Q 0.3  2. During ITX buildup with a new vial utilize 0.1 mL injection followed by 0.5 mL injection one week later  3. Continue Dymista one spray each nostril twice a day  4. Continue omeprazole 40 mg in a.m. plus ranitidine 300 mg in PM  5. Obtain fall flu vaccine  6. Return to clinic in 1 year or earlier if problem

## 2017-03-19 ENCOUNTER — Ambulatory Visit (INDEPENDENT_AMBULATORY_CARE_PROVIDER_SITE_OTHER): Payer: Managed Care, Other (non HMO)

## 2017-03-19 DIAGNOSIS — J309 Allergic rhinitis, unspecified: Secondary | ICD-10-CM | POA: Diagnosis not present

## 2017-04-22 ENCOUNTER — Ambulatory Visit (INDEPENDENT_AMBULATORY_CARE_PROVIDER_SITE_OTHER): Payer: Managed Care, Other (non HMO)

## 2017-04-22 DIAGNOSIS — J309 Allergic rhinitis, unspecified: Secondary | ICD-10-CM | POA: Diagnosis not present

## 2017-04-26 ENCOUNTER — Ambulatory Visit: Payer: Self-pay | Admitting: General Surgery

## 2017-04-26 NOTE — H&P (Signed)
History of Present Illness Romie Levee MD; 04/26/2017 9:15 AM) The patient is a 61 year old male who presents with a complaint of Rectal bleeding. 61 year old male who presents to the office for evaluation of rectal bleeding. 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 He presents with bleeding with every bowel movement. This is bright red blood and blood on the toilet paper. He denies any blood in his stool. He is up-to-date on his colonoscopies. He reports regular bowel habits and denies any straining. He does not sit on the toilet for long periods of time.   Past Surgical History (Tanisha A. Manson Passey, RMA; 04/26/2017 8:58 AM) Appendectomy Shoulder Surgery Right. Tonsillectomy  Diagnostic Studies History (Tanisha A. Manson Passey, RMA; 04/26/2017 8:58 AM) Colonoscopy 5-10 years ago  Allergies (Tanisha A. Manson Passey, RMA; 04/26/2017 8:59 AM) No Known Drug Allergies 04/26/2017 Allergies Reconciled  Medication History (Tanisha A. Manson Passey, RMA; 04/26/2017 9:02 AM) Dymista (137-50MCG/ACT Suspension, Nasal) Active. Mucinex (  Tablet ER, Oral) Active. Omeprazole (  Capsule DR, Oral) Active. Loratadine Allergy Relief (  Tablet Disint, Oral) Active. Docusate Sodium (  Capsule, Oral) Active. Pravastatin Sodium (  Tablet, Oral) Active. RaNITidine HCl (  Capsule, Oral) Active. DiphenhydrAMINE HCl (  Tablet, Oral) Active. Medications Reconciled  Social History (Tanisha A. Brown, RMA; 04/26/2017 8:58 AM) Caffeine use Carbonated beverages, Coffee. No alcohol use Tobacco use Never smoker.  Family History (Tanisha A. Manson Passey, RMA; 04/26/2017 8:58 AM) Cancer Mother. Depression Mother. Hypertension Mother.  Other Problems (Tanisha A. Manson Passey, RMA; 04/26/2017 8:58 AM) Back Pain Gastroesophageal Reflux Disease Hemorrhoids Hypercholesterolemia     Review of Systems (Tanisha A. Brown RMA; 04/26/2017 8:58 AM) General Not Present- Appetite Loss, Chills, Fatigue,  Fever, Night Sweats, Weight Gain and Weight Loss. Skin Not Present- Change in Wart/Mole, Dryness, Hives, Jaundice, New Lesions, Non-Healing Wounds, Rash and Ulcer. HEENT Present- Seasonal Allergies and Wears glasses/contact lenses. Not Present- Earache, Hearing Loss, Hoarseness, Nose Bleed, Oral Ulcers, Ringing in the Ears, Sinus Pain, Sore Throat, Visual Disturbances and Yellow Eyes. Respiratory Not Present- Bloody sputum, Chronic Cough, Difficulty Breathing, Snoring and Wheezing. Breast Not Present- Breast Mass, Breast Pain, Nipple Discharge and Skin Changes. Cardiovascular Not Present- Chest Pain, Difficulty Breathing Lying Down, Leg Cramps, Palpitations, Rapid Heart Rate, Shortness of Breath and Swelling of Extremities. Gastrointestinal Present- Hemorrhoids and Indigestion. Not Present- Abdominal Pain, Bloating, Bloody Stool, Change in Bowel Habits, Chronic diarrhea, Constipation, Difficulty Swallowing, Excessive gas, Gets full quickly at meals, Nausea, Rectal Pain and Vomiting. Male Genitourinary Not Present- Blood in Urine, Change in Urinary Stream, Frequency, Impotence, Nocturia, Painful Urination, Urgency and Urine Leakage. Musculoskeletal Not Present- Back Pain, Joint Pain, Joint Stiffness, Muscle Pain, Muscle Weakness and Swelling of Extremities. Neurological Not Present- Decreased Memory, Fainting, Headaches, Numbness, Seizures, Tingling, Tremor, Trouble walking and Weakness. Psychiatric Not Present- Anxiety, Bipolar, Change in Sleep Pattern, Depression, Fearful and Frequent crying. Endocrine Not Present- Cold Intolerance, Excessive Hunger, Hair Changes, Heat Intolerance, Hot flashes and New Diabetes. Hematology Not Present- Blood Thinners, Easy Bruising, Excessive bleeding, Gland problems, HIV and Persistent Infections.  Vitals (Tanisha A. Brown RMA; 04/26/2017 8:59 AM) 04/26/2017 8:59 AM Weight: 223.4 lb Height: 73in Body Surface Area: 2.26 m Body Mass Index: 29.47 kg/m  Temp.:  97.34F  Pulse: 95 (Regular)  BP: 124/84 (Sitting, Left Arm, Standard)      Physical Exam Romie Levee MD; 04/26/2017 9:20 AM)  General Mental Status-Alert. General Appearance-Not in acute distress. Build & Nutrition-Well nourished. Posture-Normal posture. Gait-Normal.  Head and Neck Head-normocephalic, atraumatic with no lesions or palpable masses. Trachea-midline.  Chest and Lung Exam Chest and lung exam reveals -on auscultation, normal breath sounds, no adventitious sounds and normal vocal resonance.  Cardiovascular Cardiovascular examination reveals -normal heart sounds, regular rate and rhythm with no murmurs and no digital clubbing, cyanosis, edema, increased warmth or tenderness.  Abdomen Inspection Inspection of the abdomen reveals - No Hernias. Palpation/Percussion Palpation and Percussion of the abdomen reveal - Soft, Non Tender, No Rigidity (guarding), No hepatosplenomegaly and No Palpable abdominal masses.  Rectal Anorectal Exam External - Non-tender. Internal - normal sphincter tone, Non-tender.  Neurologic Neurologic evaluation reveals -alert and oriented x 3 with no impairment of recent or remote memory, normal attention span and ability to concentrate, normal sensation and normal coordination.  Musculoskeletal Normal Exam - Bilateral-Upper Extremity Strength Normal and Lower Extremity Strength Normal.   ANOSCOPY, DIAGNOSTIC (32440) [ Hemorrhoids ] Procedure Other: Procedure: Anoscopy Surgeon: Maisie Fus After the risks and benefits were explained, verbal consent was obtained for above procedure. A medical assistant chaperone was present thoroughout the entire procedure. Anesthesia: none Diagnosis: rectal bleeding Findings: grade 3 R posterior int hemorrhoid  Performed: 04/26/2017 9:20 AM    Assessment & Plan Romie Levee MD; 04/26/2017 9:20 AM)  PROLAPSED INTERNAL HEMORRHOIDS, GRADE 3 (N02.7) Impression: 61 year old male  status post THD procedure for bleeding internal hemorrhoids 5 years ago. He presents to the office with recurrent bleeding. On exam today he has a grade 3 right anterior internal hemorrhoid. We discussed the typical treatment for this which is standard hemorrhoidectomy. We discussed that a colonoscopy could be indicated given that he has had recurrent bleeding even though he is not due. He would like to wait on the colonoscopy for now and address the hemorrhoid. If he continues to have bleeding after hemorrhoidectomy, we will pursue repeat colonoscopy.  Risks of pain, bleeding and recurrence addressed.  I believe patient understands this and agrees to proceed.

## 2017-05-12 ENCOUNTER — Telehealth: Payer: Self-pay | Admitting: *Deleted

## 2017-05-12 NOTE — Telephone Encounter (Signed)
Called patient to f/u on checkng NMR.  He was on pravastatin 40, increased to 80 but changed to crestor to see if this would be more effective.  He could not tolerate so went back to pravastatin 80.  Asked to wait until his annual visit with Dr. Tenny Crawoss to repeat NMR.  Appt scheduled for Feb.

## 2017-05-19 ENCOUNTER — Ambulatory Visit (INDEPENDENT_AMBULATORY_CARE_PROVIDER_SITE_OTHER): Payer: Managed Care, Other (non HMO) | Admitting: *Deleted

## 2017-05-19 DIAGNOSIS — J309 Allergic rhinitis, unspecified: Secondary | ICD-10-CM | POA: Diagnosis not present

## 2017-06-21 ENCOUNTER — Ambulatory Visit (INDEPENDENT_AMBULATORY_CARE_PROVIDER_SITE_OTHER): Payer: Managed Care, Other (non HMO)

## 2017-06-21 DIAGNOSIS — J309 Allergic rhinitis, unspecified: Secondary | ICD-10-CM | POA: Diagnosis not present

## 2017-06-25 ENCOUNTER — Encounter (HOSPITAL_BASED_OUTPATIENT_CLINIC_OR_DEPARTMENT_OTHER): Payer: Self-pay | Admitting: *Deleted

## 2017-06-25 ENCOUNTER — Other Ambulatory Visit: Payer: Self-pay

## 2017-06-25 NOTE — Progress Notes (Signed)
Npo after midnight food, clear liquids from midnight until 700 am, then npo take azelastine-fluticasone nasal spray, loratadine, omeprazole in am arrive 1100 a, 07-02-17 [facility] wife driver, lov dr Tenny Crawross cardiology 09-11-16 cardiology epic and on chart, ct cardiac scoring 2-29-18 epic and on chart, ekg 09-11-16 epic and on chart, exercise tolerance test 08-10-14 [facility] epic and on chart, needs hemaglobin

## 2017-07-02 ENCOUNTER — Encounter (HOSPITAL_BASED_OUTPATIENT_CLINIC_OR_DEPARTMENT_OTHER)
Admission: RE | Disposition: A | Discharge status: Home / Self Care | Payer: Self-pay | Source: Ambulatory Visit | Attending: General Surgery

## 2017-07-02 ENCOUNTER — Ambulatory Visit (HOSPITAL_BASED_OUTPATIENT_CLINIC_OR_DEPARTMENT_OTHER): Payer: 59 | Admitting: Anesthesiology

## 2017-07-02 ENCOUNTER — Ambulatory Visit (HOSPITAL_BASED_OUTPATIENT_CLINIC_OR_DEPARTMENT_OTHER)
Admission: RE | Admit: 2017-07-02 | Discharge: 2017-07-02 | Disposition: A | Discharge status: Home / Self Care | Payer: 59 | Source: Ambulatory Visit | Attending: General Surgery | Admitting: General Surgery

## 2017-07-02 ENCOUNTER — Encounter (HOSPITAL_BASED_OUTPATIENT_CLINIC_OR_DEPARTMENT_OTHER): Payer: Self-pay

## 2017-07-02 DIAGNOSIS — Z818 Family history of other mental and behavioral disorders: Secondary | ICD-10-CM | POA: Insufficient documentation

## 2017-07-02 DIAGNOSIS — M549 Dorsalgia, unspecified: Secondary | ICD-10-CM | POA: Insufficient documentation

## 2017-07-02 DIAGNOSIS — E7800 Pure hypercholesterolemia, unspecified: Secondary | ICD-10-CM | POA: Insufficient documentation

## 2017-07-02 DIAGNOSIS — K449 Diaphragmatic hernia without obstruction or gangrene: Secondary | ICD-10-CM | POA: Diagnosis not present

## 2017-07-02 DIAGNOSIS — K219 Gastro-esophageal reflux disease without esophagitis: Secondary | ICD-10-CM | POA: Insufficient documentation

## 2017-07-02 DIAGNOSIS — Z79899 Other long term (current) drug therapy: Secondary | ICD-10-CM | POA: Insufficient documentation

## 2017-07-02 DIAGNOSIS — Z809 Family history of malignant neoplasm, unspecified: Secondary | ICD-10-CM | POA: Diagnosis not present

## 2017-07-02 DIAGNOSIS — K625 Hemorrhage of anus and rectum: Secondary | ICD-10-CM | POA: Diagnosis not present

## 2017-07-02 DIAGNOSIS — Z8249 Family history of ischemic heart disease and other diseases of the circulatory system: Secondary | ICD-10-CM | POA: Insufficient documentation

## 2017-07-02 DIAGNOSIS — K642 Third degree hemorrhoids: Secondary | ICD-10-CM | POA: Insufficient documentation

## 2017-07-02 HISTORY — DX: Other complications of anesthesia, initial encounter: T88.59XA

## 2017-07-02 HISTORY — PX: HEMORRHOID SURGERY: SHX153

## 2017-07-02 HISTORY — DX: Nausea with vomiting, unspecified: R11.2

## 2017-07-02 HISTORY — DX: Other specified postprocedural states: Z98.890

## 2017-07-02 HISTORY — DX: Adverse effect of unspecified anesthetic, initial encounter: T41.45XA

## 2017-07-02 HISTORY — DX: Family history of other specified conditions: Z84.89

## 2017-07-02 SURGERY — HEMORRHOIDECTOMY
Anesthesia: Monitor Anesthesia Care

## 2017-07-02 MED ORDER — FENTANYL CITRATE (PF) 100 MCG/2ML IJ SOLN
25.0000 ug | INTRAMUSCULAR | Status: DC | PRN
  Filled 2017-07-02: qty 1

## 2017-07-02 MED ORDER — DEXAMETHASONE SODIUM PHOSPHATE 10 MG/ML IJ SOLN
INTRAMUSCULAR | Status: DC | PRN
Start: 2017-07-02 — End: 2017-07-02
  Administered 2017-07-02: 10 mg via INTRAVENOUS

## 2017-07-02 MED ORDER — GABAPENTIN 300 MG PO CAPS
300.0000 mg | ORAL_CAPSULE | ORAL | Status: AC
  Administered 2017-07-02: 300 mg via ORAL
  Filled 2017-07-02: qty 1

## 2017-07-02 MED ORDER — SODIUM CHLORIDE 0.9% FLUSH
3.0000 mL | INTRAVENOUS | Status: DC | PRN
  Filled 2017-07-02: qty 3

## 2017-07-02 MED ORDER — MIDAZOLAM HCL 5 MG/5ML IJ SOLN
INTRAMUSCULAR | Status: DC | PRN
  Administered 2017-07-02: 4 mg via INTRAVENOUS

## 2017-07-02 MED ORDER — OXYCODONE HCL 5 MG PO TABS: 5.0000 mg | ORAL_TABLET | ORAL | 0 refills | Status: DC | PRN

## 2017-07-02 MED ORDER — FENTANYL CITRATE (PF) 100 MCG/2ML IJ SOLN
INTRAMUSCULAR | Status: AC
  Filled 2017-07-02: qty 2

## 2017-07-02 MED ORDER — ONDANSETRON HCL 4 MG/2ML IJ SOLN
INTRAMUSCULAR | Status: DC | PRN
  Administered 2017-07-02: 4 mg via INTRAVENOUS

## 2017-07-02 MED ORDER — FENTANYL CITRATE (PF) 100 MCG/2ML IJ SOLN
INTRAMUSCULAR | Status: DC | PRN
  Administered 2017-07-02: 50 ug via INTRAVENOUS

## 2017-07-02 MED ORDER — ACETAMINOPHEN 500 MG PO TABS
1000.0000 mg | ORAL_TABLET | ORAL | Status: AC
  Administered 2017-07-02: 1000 mg via ORAL
  Filled 2017-07-02: qty 2

## 2017-07-02 MED ORDER — ACETAMINOPHEN 650 MG RE SUPP
650.0000 mg | RECTAL | Status: DC | PRN
  Filled 2017-07-02: qty 1

## 2017-07-02 MED ORDER — GABAPENTIN 300 MG PO CAPS
ORAL_CAPSULE | ORAL | Status: AC
  Filled 2017-07-02: qty 1

## 2017-07-02 MED ORDER — MIDAZOLAM HCL 2 MG/2ML IJ SOLN
INTRAMUSCULAR | Status: AC
  Filled 2017-07-02: qty 4

## 2017-07-02 MED ORDER — KETOROLAC TROMETHAMINE 30 MG/ML IJ SOLN
INTRAMUSCULAR | Status: DC | PRN
  Administered 2017-07-02: 30 mg via INTRAVENOUS

## 2017-07-02 MED ORDER — PROMETHAZINE HCL 25 MG/ML IJ SOLN
6.2500 mg | INTRAMUSCULAR | Status: DC | PRN
  Filled 2017-07-02: qty 1

## 2017-07-02 MED ORDER — DIAZEPAM 5 MG PO TABS: 5.0000 mg | ORAL_TABLET | Freq: Four times a day (QID) | ORAL | 0 refills | Status: DC | PRN

## 2017-07-02 MED ORDER — KETOROLAC TROMETHAMINE 30 MG/ML IJ SOLN
INTRAMUSCULAR | Status: AC
Start: 2017-07-02 — End: ?
  Filled 2017-07-02: qty 1

## 2017-07-02 MED ORDER — OXYCODONE HCL 5 MG PO TABS
5.0000 mg | ORAL_TABLET | ORAL | Status: DC | PRN
  Filled 2017-07-02: qty 2

## 2017-07-02 MED ORDER — BUPIVACAINE LIPOSOME 1.3 % IJ SUSP
20.0000 mL | INTRAMUSCULAR | Status: DC
  Filled 2017-07-02: qty 20

## 2017-07-02 MED ORDER — BUPIVACAINE-EPINEPHRINE 0.5% -1:200000 IJ SOLN
INTRAMUSCULAR | Status: DC | PRN
  Administered 2017-07-02: 30 mL

## 2017-07-02 MED ORDER — PROPOFOL 500 MG/50ML IV EMUL
INTRAVENOUS | Status: AC
  Filled 2017-07-02: qty 50

## 2017-07-02 MED ORDER — PROPOFOL 10 MG/ML IV BOLUS
INTRAVENOUS | Status: DC | PRN
  Administered 2017-07-02: 40 mg via INTRAVENOUS
  Administered 2017-07-02: 50 mg via INTRAVENOUS

## 2017-07-02 MED ORDER — LACTATED RINGERS IV SOLN
INTRAVENOUS | Status: DC
  Administered 2017-07-02 (×2): via INTRAVENOUS
  Filled 2017-07-02: qty 1000

## 2017-07-02 MED ORDER — SODIUM CHLORIDE 0.9 % IV SOLN
250.0000 mL | INTRAVENOUS | Status: DC | PRN
  Filled 2017-07-02: qty 250

## 2017-07-02 MED ORDER — SODIUM CHLORIDE 0.9% FLUSH
3.0000 mL | Freq: Two times a day (BID) | INTRAVENOUS | Status: DC
  Filled 2017-07-02: qty 3

## 2017-07-02 MED ORDER — ACETAMINOPHEN 325 MG PO TABS
650.0000 mg | ORAL_TABLET | ORAL | Status: DC | PRN
  Filled 2017-07-02: qty 2

## 2017-07-02 MED ORDER — PROPOFOL 500 MG/50ML IV EMUL
INTRAVENOUS | Status: DC | PRN
  Administered 2017-07-02: 75 ug/kg/min via INTRAVENOUS

## 2017-07-02 MED ORDER — LIDOCAINE 5 % EX OINT
TOPICAL_OINTMENT | CUTANEOUS | Status: DC | PRN
  Administered 2017-07-02: 1

## 2017-07-02 MED ORDER — LIDOCAINE 2% (20 MG/ML) 5 ML SYRINGE
INTRAMUSCULAR | Status: AC
  Filled 2017-07-02: qty 5

## 2017-07-02 MED ORDER — SCOPOLAMINE 1 MG/3DAYS TD PT72
MEDICATED_PATCH | TRANSDERMAL | Status: DC | PRN
  Administered 2017-07-02: 1 via TRANSDERMAL

## 2017-07-02 MED ORDER — ACETAMINOPHEN 500 MG PO TABS
ORAL_TABLET | ORAL | Status: AC
  Filled 2017-07-02: qty 2

## 2017-07-02 MED ORDER — BUPIVACAINE LIPOSOME 1.3 % IJ SUSP
INTRAMUSCULAR | Status: DC | PRN
  Administered 2017-07-02: 20 mL

## 2017-07-02 MED ORDER — SCOPOLAMINE 1 MG/3DAYS TD PT72
MEDICATED_PATCH | TRANSDERMAL | Status: AC
  Filled 2017-07-02: qty 1

## 2017-07-02 SURGICAL SUPPLY — 38 items
BLADE HEX COATED 2.75 (ELECTRODE) ×2 IMPLANT
BLADE SURG 10 STRL SS (BLADE) ×2 IMPLANT
BRIEF STRETCH FOR OB PAD LRG (UNDERPADS AND DIAPERS) IMPLANT
COVER BACK TABLE 60X90IN (DRAPES) ×2 IMPLANT
COVER MAYO STAND STRL (DRAPES) ×2 IMPLANT
DRAPE LAPAROTOMY 100X72 PEDS (DRAPES) ×2 IMPLANT
DRAPE UTILITY XL STRL (DRAPES) ×2 IMPLANT
ELECT BLADE 6.5 .24CM SHAFT (ELECTRODE) IMPLANT
ELECT REM PT RETURN 9FT ADLT (ELECTROSURGICAL) ×2
ELECTRODE REM PT RTRN 9FT ADLT (ELECTROSURGICAL) ×1 IMPLANT
GAUZE SPONGE 4X4 12PLY STRL LF (GAUZE/BANDAGES/DRESSINGS) ×1 IMPLANT
GAUZE SPONGE 4X4 16PLY XRAY LF (GAUZE/BANDAGES/DRESSINGS) IMPLANT
GLOVE BIO SURGEON STRL SZ 6.5 (GLOVE) ×2 IMPLANT
GLOVE INDICATOR 7.0 STRL GRN (GLOVE) ×2 IMPLANT
GOWN SPEC L3 XXLG W/TWL (GOWN DISPOSABLE) ×4 IMPLANT
KIT RM TURNOVER CYSTO AR (KITS) ×2 IMPLANT
NEEDLE HYPO 22GX1.5 SAFETY (NEEDLE) ×2 IMPLANT
NS IRRIG 500ML POUR BTL (IV SOLUTION) ×2 IMPLANT
PACK BASIN DAY SURGERY FS (CUSTOM PROCEDURE TRAY) ×2 IMPLANT
PAD ABD 8X10 STRL (GAUZE/BANDAGES/DRESSINGS) ×2 IMPLANT
PAD ARMBOARD 7.5X6 YLW CONV (MISCELLANEOUS) IMPLANT
PENCIL BUTTON HOLSTER BLD 10FT (ELECTRODE) ×2 IMPLANT
SPONGE GAUZE 4X4 12PLY (GAUZE/BANDAGES/DRESSINGS) ×2 IMPLANT
SPONGE SURGIFOAM ABS GEL 100 (HEMOSTASIS) IMPLANT
SPONGE SURGIFOAM ABS GEL 12-7 (HEMOSTASIS) IMPLANT
SUT CHROMIC 2 0 SH (SUTURE) IMPLANT
SUT CHROMIC 3 0 SH 27 (SUTURE) IMPLANT
SUT PROLENE 2 0 BLUE (SUTURE) IMPLANT
SUT VIC AB 2-0 SH 27 (SUTURE)
SUT VIC AB 2-0 SH 27XBRD (SUTURE) IMPLANT
SUT VIC AB 4-0 P-3 18XBRD (SUTURE) IMPLANT
SUT VIC AB 4-0 P3 18 (SUTURE)
SUT VIC AB 4-0 SH 18 (SUTURE) IMPLANT
SYR CONTROL 10ML LL (SYRINGE) ×2 IMPLANT
TRAY DSU PREP LF (CUSTOM PROCEDURE TRAY) ×2 IMPLANT
TUBE CONNECTING 12X1/4 (SUCTIONS) ×2 IMPLANT
WATER STERILE IRR 500ML POUR (IV SOLUTION) IMPLANT
YANKAUER SUCT BULB TIP NO VENT (SUCTIONS) ×2 IMPLANT

## 2017-07-02 NOTE — Discharge Instructions (Addendum)
Post Anesthesia Home Care Instructions  Activity: Get plenty of rest for the remainder of the day. A responsible individual must stay with you for 24 hours following the procedure.  For the next 24 hours, DO NOT: -Drive a car -Advertising copywriterperate machinery -Drink alcoholic beverages -Take any medication unless instructed by your physician -Make any legal decisions or sign important papers.  Meals: Start with liquid foods such as gelatin or soup. Progress to regular foods as tolerated. Avoid greasy, spicy, heavy foods. If nausea and/or vomiting occur, drink only clear liquids until the nausea and/or vomiting subsides. Call your physician if vomiting continues.  Special Instructions/Symptoms: Your throat may feel dry or sore from the anesthesia or the breathing tube placed in your throat during surgery. If this causes discomfort, gargle with warm salt water. The discomfort should disappear within 24 hours.  If you had a scopolamine patch placed behind your ear for the management of post- operative nausea and/or vomiting:  1. The medication in the patch is effective for 72 hours, after which it should be removed.  Wrap patch in a tissue and discard in the trash. Wash hands thoroughly with soap and water. 2. You may remove the patch earlier than 72 hours if you experience unpleasant side effects which may include dry mouth, dizziness or visual disturbances. 3. Avoid touching the patch. Wash your hands with soap and water after contact with the patch.  Information for Discharge Teaching: EXPAREL (bupivacaine liposome injectable suspension)   Your surgeon gave you EXPAREL(bupivacaine) in your surgical incision to help control your pain after surgery.   EXPAREL is a local anesthetic that provides pain relief by numbing the tissue around the surgical site.  EXPAREL is designed to release pain medication over time and can control pain for up to 72 hours.  Depending on how you respond to EXPAREL, you may  require less pain medication during your recovery.  Possible side effects:  Temporary loss of sensation or ability to move in the area where bupivacaine was injected.  Nausea, vomiting, constipation  Rarely, numbness and tingling in your mouth or lips, lightheadedness, or anxiety may occur.  Call your doctor right away if you think you may be experiencing any of these sensations, or if you have other questions regarding possible side effects.  Follow all other discharge instructions given to you by your surgeon or nurse. Eat a healthy diet and drink plenty of water or other fluids.  If you return to the hospital for any reason within 96 hours following the administration of EXPAREL, please inform your health care providers.   ANORECTAL SURGERY: POST OP INSTRUCTIONS 1. Take your usually prescribed home medications unless otherwise directed. 2. DIET: During the first few hours after surgery sip on some liquids until you are able to urinate.  It is normal to not urinate for several hours after this surgery.  If you feel uncomfortable, please contact the office for instructions.  After you are able to urinate,you may eat, if you feel like it.  Follow a light bland diet the first 24 hours after arrival home, such as soup, liquids, crackers, etc.  Be sure to include lots of fluids daily (6-8 glasses).  Avoid fast food or heavy meals, as your are more likely to get nauseated.  Eat a low fat diet the next few days after surgery.  Limit caffeine intake to 1-2 servings a day. 3. PAIN CONTROL: a. Pain is best controlled by a usual combination of several different methods TOGETHER: i.  Muscle relaxation 1.  Soak in a warm bath (or Sitz bath) three times a day and after bowel movements.  Continue to do this until all pain is resolved. 2. Take the muscle relaxer (Valium) every 6 hours for the first 2 days after surgery  ii. Over the counter pain medication iii. Prescription pain medication b. Most  patients will experience some swelling and discomfort in the anus/rectal area and incisions.  Heat such as warm towels, sitz baths, warm baths, etc to help relax tight/sore spots and speed recovery.  Some people prefer to use ice, especially in the first couple days after surgery, as it may decrease the pain and swelling, or alternate between ice & heat.  Experiment to what works for you.  Swelling and bruising can take several weeks to resolve.  Pain can take even longer to completely resolve. c. It is helpful to take an over-the-counter pain medication regularly for the first few weeks.  Choose one of the following that works best for you: i. Naproxen (Aleve, etc)  Two 220mg  tabs twice a day ii. Ibuprofen (Advil, etc) Three 200mg  tabs four times a day (every meal & bedtime) d. A  prescription for pain medication (such as percocet, oxycodone, hydrocodone, etc) should be given to you upon discharge.  Take your pain medication as prescribed.  i. If you are having problems/concerns with the prescription medicine (does not control pain, nausea, vomiting, rash, itching, etc), please call us [phone] to see if we need to switch you to a different pain medicine that will work better for you and/or control your side effect better. ii. If you need a refill on your pain medication, please contact your pharmacy.  They will contact our office to request authorization. Prescriptions will not be filled after 5 pm or on week-ends. 4. KEEP YOUR BOWELS REGULAR and AVOID CONSTIPATION a. The goal is one to two soft bowel movements a day.  You should at least have a bowel movement every other day. b. Avoid getting constipated.  Between the surgery and the pain medications, it is common to experience some constipation. This can be very painful after rectal surgery.  Increasing fluid intake and taking a fiber supplement (such as Metamucil, Citrucel, FiberCon, etc) 1-2 times a day regularly will usually help prevent this  problem from occurring.  A stool softener like colace is also recommended.  This can be purchased over the counter at your pharmacy.  You can take it up to 3 times a day.  If you do not have a bowel movement after 24 hrs since your surgery, take one does of milk of magnesia.  If you still haven't had a bowel movement 8-12 hours after that dose, take another dose.  If you don't have a bowel movement 48 hrs after surgery, purchase a Fleets enema from the drug store and administer gently per package instructions.  If you still are having trouble with your bowel movements after that, please call the office for further instructions. c. If you develop diarrhea or have many loose bowel movements, simplify your diet to bland foods & liquids for a few days.  Stop any stool softeners and decrease your fiber supplement.  Switching to mild anti-diarrheal medications (Kayopectate, Pepto Bismol) can help.  If this worsens or does not improve, please call us.  5. Wound Care a. Remove your bandages before your first bowel movement or 8 hours after surgery.     b. Remove any wound packing material at this  tim,e as well.  You do not need to repack the wound unless instructed otherwise.  Wear an absorbent pad or soft cotton gauze in your underwear to catch any drainage and help keep the area clean. You should change this every 2-3 hours while awake. c. Keep the area clean and dry.  Bathe / shower every day, especially after bowel movements.  Keep the area clean by showering / bathing over the incision / wound.   It is okay to soak an open wound to help wash it.  Wet wipes or showers / gentle washing after bowel movements is often less traumatic than regular toilet paper. d. Bonita QuinYou may have some styrofoam-like soft packing in the rectum which will come out with the first bowel movement.  e. You will often notice bleeding with bowel movements.  This should slow down by the end of the first week of surgery f. Expect some drainage.   This should slow down, too, by the end of the first week of surgery.  Wear an absorbent pad or soft cotton gauze in your underwear until the drainage stops. g. Do Not sit on a rubber or pillow ring.  This can make you symptoms worse.  You may sit on a soft pillow if needed.  6. ACTIVITIES as tolerated:   a. You may resume regular (light) daily activities beginning the next day--such as daily self-care, walking, climbing stairs--gradually increasing activities as tolerated.  If you can walk 30 minutes without difficulty, it is safe to try more intense activity such as jogging, treadmill, bicycling, low-impact aerobics, swimming, etc. b. Save the most intensive and strenuous activity for last such as sit-ups, heavy lifting, contact sports, etc  Refrain from any heavy lifting or straining until you are off narcotics for pain control.   c. You may drive when you are no longer taking prescription pain medication, you can comfortably sit for long periods of time, and you can safely maneuver your car and apply brakes. d. Bonita QuinYou may have sexual intercourse when it is comfortable.  7. FOLLOW UP in our office a. Please call [facility] at [phone] to set up an appointment to see your surgeon in the office for a follow-up appointment approximately 3-4 weeks after your surgery. b. Make sure that you call for this appointment the day you arrive home to insure a convenient appointment time. 10. IF YOU HAVE DISABILITY OR FAMILY LEAVE FORMS, BRING THEM TO THE OFFICE FOR PROCESSING.  DO NOT GIVE THEM TO YOUR DOCTOR.     WHEN TO CALL US [phone]: 1. Poor pain control 2. Reactions / problems with new medications (rash/itching, nausea, etc)  3. Fever over 101.5 F (38.5 C) 4. Inability to urinate 5. Nausea and/or vomiting 6. Worsening swelling or bruising 7. Continued bleeding from incision. 8. Increased pain, redness, or drainage from the incision  The clinic staff is available to answer your questions during  regular business hours (8:30am-5pm).  Please dont hesitate to call and ask to speak to one of our nurses for clinical concerns.   A surgeon from [facility] is always on call at the hospitals   If you have a medical emergency, go to the nearest emergency room or call 911.    [facility] [address], [address], [place], [place]  [id number] ? MAIN: [phone] ? TOLL FREE: [phone] ? FAX [phone] [email or web address]

## 2017-07-02 NOTE — Anesthesia Procedure Notes (Signed)
Procedure Name: MAC Date/Time: 07/02/2017 12:55 PM Performed by: Bonney Aid, CRNA Pre-anesthesia Checklist: Patient identified, Timeout performed, Emergency Drugs available, Suction available and Patient being monitored Patient Re-evaluated:Patient Re-evaluated prior to induction Oxygen Delivery Method: Nasal cannula Placement Confirmation: positive ETCO2

## 2017-07-02 NOTE — H&P (Signed)
The patient is a 61 year old male who presents with a complaint of Rectal bleeding. 61 year old male who presents to the office for evaluation of rectal bleeding. 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 He presents with bleeding with every bowel movement. This is bright red blood and blood on the toilet paper. He denies any blood in his stool. He is up-to-date on his colonoscopies. He reports regular bowel habits and denies any straining. He does not sit on the toilet for long periods of time.   Past Surgical History (Tanisha A. Manson PasseyBrown, RMA; 04/26/2017 8:58 AM) Appendectomy Shoulder Surgery Right. Tonsillectomy  Diagnostic Studies History (Tanisha A. Manson PasseyBrown, RMA; 04/26/2017 8:58 AM) Colonoscopy 5-10 years ago  Allergies (Tanisha A. Manson PasseyBrown, RMA; 04/26/2017 8:59 AM) No Known Drug Allergies 04/26/2017 Allergies Reconciled  Medication History (Tanisha A. Manson PasseyBrown, RMA; 04/26/2017 9:02 AM) Dymista (137-50MCG/ACT Suspension, Nasal) Active. Mucinex (600MG  Tablet ER, Oral) Active. Omeprazole (20MG  Capsule DR, Oral) Active. Loratadine Allergy Relief (10MG  Tablet Disint, Oral) Active. Docusate Sodium (100MG  Capsule, Oral) Active. Pravastatin Sodium (80MG  Tablet, Oral) Active. RaNITidine HCl (150MG  Capsule, Oral) Active. DiphenhydrAMINE HCl (25MG  Tablet, Oral) Active. Medications Reconciled  Social History (Tanisha A. Brown, RMA; 04/26/2017 8:58 AM) Caffeine use Carbonated beverages, Coffee. No alcohol use Tobacco use Never smoker.  Family History (Tanisha A. Manson PasseyBrown, RMA; 04/26/2017 8:58 AM) Cancer Mother. Depression Mother. Hypertension Mother.  Other Problems (Tanisha A. Manson PasseyBrown, RMA; 04/26/2017 8:58 AM) Back Pain Gastroesophageal Reflux Disease Hemorrhoids Hypercholesterolemia   Review of Systems  General Not Present- Appetite Loss, Chills, Fatigue, Fever, Night Sweats, Weight Gain and Weight Loss. Skin Not Present- Change in Wart/Mole, Dryness,  Hives, Jaundice, New Lesions, Non-Healing Wounds, Rash and Ulcer. HEENT Present- Seasonal Allergies and Wears glasses/contact lenses. Not Present- Earache, Hearing Loss, Hoarseness, Nose Bleed, Oral Ulcers, Ringing in the Ears, Sinus Pain, Sore Throat, Visual Disturbances and Yellow Eyes. Respiratory Not Present- Bloody sputum, Chronic Cough, Difficulty Breathing, Snoring and Wheezing. Breast Not Present- Breast Mass, Breast Pain, Nipple Discharge and Skin Changes. Cardiovascular Not Present- Chest Pain, Difficulty Breathing Lying Down, Leg Cramps, Palpitations, Rapid Heart Rate, Shortness of Breath and Swelling of Extremities. Gastrointestinal Present- Hemorrhoids and Indigestion. Not Present- Abdominal Pain, Bloating, Bloody Stool, Change in Bowel Habits, Chronic diarrhea, Constipation, Difficulty Swallowing, Excessive gas, Gets full quickly at meals, Nausea, Rectal Pain and Vomiting. Male Genitourinary Not Present- Blood in Urine, Change in Urinary Stream, Frequency, Impotence, Nocturia, Painful Urination, Urgency and Urine Leakage. Musculoskeletal Not Present- Back Pain, Joint Pain, Joint Stiffness, Muscle Pain, Muscle Weakness and Swelling of Extremities. Neurological Not Present- Decreased Memory, Fainting, Headaches, Numbness, Seizures, Tingling, Tremor, Trouble walking and Weakness. Psychiatric Not Present- Anxiety, Bipolar, Change in Sleep Pattern, Depression, Fearful and Frequent crying. Endocrine Not Present- Cold Intolerance, Excessive Hunger, Hair Changes, Heat Intolerance, Hot flashes and New Diabetes. Hematology Not Present- Blood Thinners, Easy Bruising, Excessive bleeding, Gland problems, HIV and Persistent Infections.  BP (!) 150/81   Pulse 76   Temp 98.4 F (36.9 C) (Oral)   Resp 18   Ht 6\' 1"  (1.854 m)   Wt 99.1 kg (218 lb 8 oz)   SpO2 99%   BMI 28.83 kg/m    Physical Exam   General Mental Status-Alert. General Appearance-Not in acute distress. Build &  Nutrition-Well nourished. Posture-Normal posture. Gait-Normal.  Head and Neck Head-normocephalic, atraumatic with no lesions or palpable masses. Trachea-midline.  Chest and Lung Exam Chest and lung exam reveals -on auscultation, normal breath sounds, no adventitious sounds and normal vocal  resonance.  Cardiovascular Cardiovascular examination reveals -normal heart sounds, regular rate and rhythm with no murmurs and no digital clubbing, cyanosis, edema, increased warmth or tenderness.  Abdomen Inspection Inspection of the abdomen reveals - No Hernias. Palpation/Percussion Palpation and Percussion of the abdomen reveal - Soft, Non Tender, No Rigidity (guarding), No hepatosplenomegaly and No Palpable abdominal masses.  Rectal Anorectal Exam External - Non-tender. Internal - normal sphincter tone, Non-tender.  Neurologic Neurologic evaluation reveals -alert and oriented x 3 with no impairment of recent or remote memory, normal attention span and ability to concentrate, normal sensation and normal coordination.  Musculoskeletal Normal Exam - Bilateral-Upper Extremity Strength Normal and Lower Extremity Strength Normal.   ANOSCOPY, DIAGNOSTIC ([id number]) [ Hemorrhoids ] Procedure Other: Procedure: Anoscopy Surgeon: Maisie Fushomas After the risks and benefits were explained, verbal consent was obtained for above procedure. A medical assistant chaperone was present thoroughout the entire procedure. Anesthesia: none Diagnosis: rectal bleeding Findings: grade 3 R posterior int hemorrhoid  Performed: 04/26/2017 9:20 AM    Assessment & Plan   PROLAPSED INTERNAL HEMORRHOIDS, GRADE 3 (K64.2) Impression: 61 year old male status post THD procedure for bleeding internal hemorrhoids 5 years ago. He presents to the office with recurrent bleeding. On exam today he has a grade 3 right anterior internal hemorrhoid. We discussed the typical treatment for this which is  standard hemorrhoidectomy. We discussed that a colonoscopy could be indicated given that he has had recurrent bleeding even though he is not due. He would like to wait on the colonoscopy for now and address the hemorrhoid. If he continues to have bleeding after hemorrhoidectomy, we will pursue repeat colonoscopy.  Risks of pain, bleeding and recurrence addressed.  I believe patient understands this and agrees to proceed.

## 2017-07-02 NOTE — Transfer of Care (Signed)
Immediate Anesthesia Transfer of Care Note  Patient: Tyler Taylor, Dr.  Jule Ser) Performed: SINGLE COLUMN HEMORRHOIDECTOMY (N/A )  Patient Location: PACU  Anesthesia Type:MAC  Level of Consciousness: awake, alert  and oriented  Airway & Oxygen Therapy: Patient Spontanous Breathing and Patient connected to nasal cannula oxygen  Post-op Assessment: Report given to RN  Post vital signs: Reviewed and stable  Last Vitals: 129/84, 100, 19, 99% Vitals:   07/02/17 1015  BP: (!) 150/81  Pulse: 76  Resp: 18  Temp: 36.9 C  SpO2: 99%    Last Pain:  Vitals:   07/02/17 1015  TempSrc: Oral      Patients Stated Pain Goal: 5 (72/07/21 8288)  Complications: No apparent anesthesia complications

## 2017-07-02 NOTE — Op Note (Addendum)
07/02/2017  1:18 PM  PATIENT:  Tyler Taylor, Dr.  61 y.o. male  Patient Care Team: [facility] as PCP - General (Family Medicine) Ladene Artist, MD as Referring Physician (Gastroenterology)  PRE-OPERATIVE DIAGNOSIS:  GRADE 3 Internal Hemorrhoids  POST-OPERATIVE DIAGNOSIS:  GRADE 3 Internal Hemorrhoids  PROCEDURE:   SINGLE COLUMN HEMORRHOIDECTOMY    Surgeon(s): Leighton Ruff, MD  ASSISTANT: none   ANESTHESIA:   local  SPECIMEN:  Source of Specimen:  L lateral hemorrhoid  DISPOSITION OF SPECIMEN:  PATHOLOGY  COUNTS:  YES  PLAN OF CARE: Discharge to home after PACU  PATIENT DISPOSITION:  PACU - hemodynamically stable.  INDICATION: 61 year old male approximate 5 years status post [place] who presents with continued rectal bleeding.  On exam in the office he was noted to have a grade 3 left lateral hemorrhoid.  We discussed the risk and benefits of his surgical options and he decided to proceed with hemorrhoidectomy.   OR FINDINGS: Grade 3 left lateral internal hemorrhoid, grade 1 right anterior and right posterior hemorrhoids.  DESCRIPTION: the patient was identified in the preoperative holding area and taken to the OR where they were laid on the operating room table.  MAC anesthesia was induced without difficulty. The patient was then positioned in prone jackknife position with buttocks gently taped apart.  The patient was then prepped and draped in usual sterile fashion.  SCDs were noted to be in place prior to the initiation of anesthesia. A surgical timeout was performed indicating the correct patient, procedure, positioning and need for preoperative antibiotics.  A rectal block was performed using Marcaine with epinephrine mixed with Exparel.    I began with a digital rectal exam.  There were no masses palpated I then placed a Hill-Ferguson anoscope into the anal canal and evaluated this completely.  Patient had a grade 3 left lateral internal  hemorrhoid.  He had a grade 1 right anterior right posterior hemorrhoid.  I elevated the left lateral hemorrhoid using an Allis clamp.  I divided the skin using Metzenbaum scissors.  I then dissected bluntly into the space between the hemorrhoid tissue and the sphincter complex.  I then divided the anoderm laterally around the hemorrhoid complex using sharp dissection.  I placed a Buie clamp on the hemorrhoid column and resected the entire hemorrhoid from the anal canal.  I then closed the proximal portion of the mucosa using a running 2-0 chromic suture.  I used a 3-0 chromic suture to close from the dentate line distal.  Hemostasis was good.  Additional rectal block was added around the hemorrhoidectomy site.  The patient tolerated this well and was sent to the postanesthesia care unit in stable condition.  All counts were correct per operating room staff.  I have reviewed the [place] and see no active narcotic prescriptions to.

## 2017-07-02 NOTE — Anesthesia Preprocedure Evaluation (Addendum)
Anesthesia Evaluation  Patient identified by MRN, date of birth, ID band Patient awake    Reviewed: Allergy & Precautions, NPO status , Patient's Chart, lab work & pertinent test results  History of Anesthesia Complications (+) PONV  Airway Mallampati: II  TM Distance: >3 FB Neck ROM: Full    Dental no notable dental hx.    Pulmonary neg pulmonary ROS,    Pulmonary exam normal breath sounds clear to auscultation       Cardiovascular negative cardio ROS Normal cardiovascular exam Rhythm:Regular Rate:Normal     Neuro/Psych negative neurological ROS  negative psych ROS   GI/Hepatic Neg liver ROS, hiatal hernia, GERD  Medicated,  Endo/Other  negative endocrine ROS  Renal/GU negative Renal ROS  negative genitourinary   Musculoskeletal negative musculoskeletal ROS (+)   Abdominal   Peds negative pediatric ROS (+)  Hematology negative hematology ROS (+)   Anesthesia Other Findings   Reproductive/Obstetrics negative OB ROS                            Anesthesia Physical Anesthesia Plan  ASA: II  Anesthesia Plan: General   Post-op Pain Management:    Induction: Intravenous  PONV Risk Score and Plan: 2 and Ondansetron, Dexamethasone and Treatment may vary due to age or medical condition  Airway Management Planned: LMA and Oral ETT  Additional Equipment:   Intra-op Plan:   Post-operative Plan: Extubation in OR  Informed Consent: I have reviewed the patients History and Physical, chart, labs and discussed the procedure including the risks, benefits and alternatives for the proposed anesthesia with the patient or authorized representative who has indicated his/her understanding and acceptance.   Dental advisory given  Plan Discussed with: CRNA and Surgeon  Anesthesia Plan Comments:        Anesthesia Quick Evaluation

## 2017-07-02 NOTE — Anesthesia Postprocedure Evaluation (Signed)
Anesthesia Post Note  Patient: Tyler Taylor, Dr.  Jule Ser) Performed: SINGLE COLUMN HEMORRHOIDECTOMY (N/A )     Patient location during evaluation: PACU Anesthesia Type: MAC Level of consciousness: awake and alert Pain management: pain level controlled Vital Signs Assessment: post-procedure vital signs reviewed and stable Respiratory status: spontaneous breathing, nonlabored ventilation, respiratory function stable and patient connected to nasal cannula oxygen Cardiovascular status: stable and blood pressure returned to baseline Postop Assessment: no apparent nausea or vomiting Anesthetic complications: no    Last Vitals:  Vitals:   07/02/17 1330 07/02/17 1345  BP: 135/78 121/66  Pulse: 95 80  Resp: 19 12  Temp:    SpO2: 100% 98%    Last Pain:  Vitals:   07/02/17 1345  TempSrc:   PainSc: 0-No pain                 Effie Berkshire

## 2017-07-06 ENCOUNTER — Encounter (HOSPITAL_BASED_OUTPATIENT_CLINIC_OR_DEPARTMENT_OTHER): Payer: Self-pay | Admitting: General Surgery

## 2017-07-06 LAB — POCT HEMOGLOBIN-HEMACUE
Hemoglobin: 10.4 g/dL — ABNORMAL LOW (ref 13.0–17.0)
Hemoglobin: 14 g/dL (ref 13.0–17.0)

## 2017-07-14 ENCOUNTER — Other Ambulatory Visit: Payer: Self-pay | Admitting: Allergy and Immunology

## 2017-07-14 ENCOUNTER — Encounter: Payer: Self-pay | Admitting: *Deleted

## 2017-07-14 DIAGNOSIS — J301 Allergic rhinitis due to pollen: Secondary | ICD-10-CM | POA: Diagnosis not present

## 2017-07-14 NOTE — Progress Notes (Signed)
VIALS MADE. EXP: 07-14-18. HV 

## 2017-07-26 ENCOUNTER — Ambulatory Visit (INDEPENDENT_AMBULATORY_CARE_PROVIDER_SITE_OTHER): Payer: 59

## 2017-07-26 DIAGNOSIS — J309 Allergic rhinitis, unspecified: Secondary | ICD-10-CM

## 2017-09-06 ENCOUNTER — Ambulatory Visit (INDEPENDENT_AMBULATORY_CARE_PROVIDER_SITE_OTHER): Payer: 59 | Admitting: *Deleted

## 2017-09-06 DIAGNOSIS — J309 Allergic rhinitis, unspecified: Secondary | ICD-10-CM

## 2017-09-10 ENCOUNTER — Encounter: Payer: Self-pay | Admitting: *Deleted

## 2017-09-20 ENCOUNTER — Ambulatory Visit: Payer: 59 | Admitting: Internal Medicine

## 2017-09-27 ENCOUNTER — Ambulatory Visit (INDEPENDENT_AMBULATORY_CARE_PROVIDER_SITE_OTHER): Payer: 59 | Admitting: *Deleted

## 2017-09-27 DIAGNOSIS — J309 Allergic rhinitis, unspecified: Secondary | ICD-10-CM | POA: Diagnosis not present

## 2017-10-05 ENCOUNTER — Encounter: Payer: Self-pay | Admitting: *Deleted

## 2017-10-06 ENCOUNTER — Ambulatory Visit (INDEPENDENT_AMBULATORY_CARE_PROVIDER_SITE_OTHER): Payer: 59 | Admitting: *Deleted

## 2017-10-06 DIAGNOSIS — J309 Allergic rhinitis, unspecified: Secondary | ICD-10-CM

## 2017-10-15 ENCOUNTER — Other Ambulatory Visit: Payer: Self-pay | Admitting: Allergy and Immunology

## 2017-10-27 ENCOUNTER — Ambulatory Visit (INDEPENDENT_AMBULATORY_CARE_PROVIDER_SITE_OTHER)
Admission: RE | Admit: 2017-10-27 | Discharge: 2017-10-27 | Disposition: A | Discharge status: Home / Self Care | Payer: 59 | Source: Ambulatory Visit | Attending: Internal Medicine | Admitting: Internal Medicine

## 2017-10-27 DIAGNOSIS — R918 Other nonspecific abnormal finding of lung field: Secondary | ICD-10-CM | POA: Diagnosis not present

## 2017-11-08 ENCOUNTER — Ambulatory Visit (INDEPENDENT_AMBULATORY_CARE_PROVIDER_SITE_OTHER): Payer: 59 | Admitting: *Deleted

## 2017-11-08 DIAGNOSIS — J309 Allergic rhinitis, unspecified: Secondary | ICD-10-CM

## 2017-11-22 ENCOUNTER — Ambulatory Visit (INDEPENDENT_AMBULATORY_CARE_PROVIDER_SITE_OTHER): Payer: 59 | Admitting: Internal Medicine

## 2017-11-22 ENCOUNTER — Encounter: Payer: Self-pay | Admitting: Internal Medicine

## 2017-11-22 VITALS — BP 130/78 | HR 101 | Ht 73.0 in | Wt 229.0 lb

## 2017-11-22 DIAGNOSIS — E785 Hyperlipidemia, unspecified: Secondary | ICD-10-CM | POA: Diagnosis not present

## 2017-11-22 MED ORDER — CHLOROQUINE PHOSPHATE 500 MG PO TABS: ORAL_TABLET | ORAL | 0 refills | Status: DC

## 2017-11-22 NOTE — Patient Instructions (Signed)
Your physician recommends that you continue on your current medications as directed. Please refer to the Current Medication list given to you today.  Your physician recommends that you return for lab work nmr profile  Your physician wants you to follow-up in: 1 year with Dr. Tenny Craw.  You will receive a reminder letter in the mail two months in advance. If you don't receive a letter, please call our office to schedule the follow-up appointment.

## 2017-11-22 NOTE — Progress Notes (Signed)
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 with a history of  Hyperlipidemia   I saw him for the first time last spring   He had been  foloowed by Geanie Kenning at [facility] in past  Has been there for years  Had GXT in remotepast (95) for CP  Negative  Placed on statin in 90s  Had CP in 2000s     When I saw him initially I recomm he get a calcium score CT   His score was 17   CT also showed some small pulmonary nondules   F/U CT unchanged (though a new part of lung imaged on last screen  Small    Initially I recomm tighter contol  Increased pravastatin to 80  Repeat lipomed showed very small drop in particles  With this I recomm he switch to Crestor  Unfortunately he developed muscle aches   Went back to 80 mg daily  Since seen the pt says he is now recovering from a URI   Prior to this he says he was doing good  No CP   No dizziness.  Planning trip to [place] (mission)   Current Meds  Medication Sig  . diphenhydrAMINE (BENADRYL) 50 MG tablet Take 50 mg by mouth at bedtime.  . docusate sodium (COLACE) 100 MG capsule Take 1 capsule (100 mg total) by mouth 2 (two) times daily. (Patient taking differently: Take 100 mg by mouth daily. )  . DYMISTA 137-50 MCG/ACT SUSP PLACE 1-2 SPRAYS INTO BOTH NOSTRILS 2 (TWO) TIMES DAILY.  Marland Kitchen loratadine (CLARITIN) 10 MG tablet Take 10 mg by mouth daily.  . Multiple Vitamin (MULTIVITAMIN WITH MINERALS) TABS Take 1 tablet by mouth daily.  Marland Kitchen omeprazole (PRILOSEC) 40 MG capsule Take 40 mg by mouth daily.  . pravastatin (PRAVACHOL) 80 MG tablet Take 1 tablet (80 mg total) by mouth every evening.  . pseudoephedrine-guaifenesin (MUCINEX D) 60-600 MG per tablet Take 0.5 tablets by mouth daily.   .  ranitidine (ZANTAC) 300 MG capsule Take 300 mg by mouth every evening.     Allergies:   Patient has no known allergies.   Past Medical History:  Diagnosis Date  . Complication of anesthesia   . Family history of adverse reaction to anesthesia    severe ponv mother  . GERD (gastroesophageal reflux disease)   . Hemorrhoids   . Hiatal hernia   . Hyperlipemia, mixed   . Nocturia   . Pneumonia April 2013   walking  . PONV (postoperative nausea and vomiting)    likes scopolamine patch and propofol helped with last surgery  . Seasonal allergic rhinitis   . SOB (shortness of breath) 15 yrs ago  . Tension pneumothorax 2002   from diving    Past Surgical History:  Procedure Laterality Date  . APPENDECTOMY    . GANGLION CYST EXCISION     left wrist  . HEMORRHOID SURGERY N/A 07/02/2017   Procedure: SINGLE COLUMN HEMORRHOIDECTOMY;  Surgeon: Romie Levee, MD;  Location: [facility];  Service: General;  Laterality: N/A;  . SHOULDER SURGERY  09/2015   rotator cuff reattach superspinatour and bone spur removal  .  TONSILLECTOMY    . TRANSANAL HEMORRHOIDAL DEARTERIALIZATION  07/22/2012   Procedure: TRANSANAL HEMORRHOIDAL DEARTERIALIZATION;  Surgeon: Romie Levee, MD;  Location: [facility];  Service: General;  Laterality: N/A;     Social History:  The patient  reports that he has never smoked. He has never used smokeless tobacco. He reports that he does not drink alcohol or use drugs.   Family History:  The patient's family history includes Melanoma in his mother.    ROS:  Please see the history of present illness. All other systems are reviewed and  Negative to the above problem except as noted.    PHYSICAL EXAM: VS:  BP 130/78   Pulse (!) 101   Ht  (1.854 m)   Wt 229 lb (103.9 kg)   SpO2 97%   BMI 30.21 kg/m    BP 146/96 R arm  126/86 L arm   GEN: Well nourished, well developed, in no acute distress  HEENT: normal  Neck: JVP is normal  NO carotid bruits,  or masses Cardiac: RRR; no murmurs, rubs, or gallops,no edema  Respiratory:  clear to auscultation bilaterally, normal work of breathing GI: soft, nontender, nondistended, + BS  No hepatomegaly  MS: no deformity Moving all extremities   Skin: warm and dry, no rash Neuro:  Strength and sensation are intact Psych: euthymic mood, full affect   EKG:  EKG is ordered today.  SR 92 bpm     Lipid Panel    Component Value Date/Time   CHOL 165 12/15/2016 0833   CHOL 160 09/11/2016 1335   TRIG 130 12/15/2016 0833   HDL 46 12/15/2016 0833   CHOLHDL 3.9 09/11/2016 1335   LDLCALC 94 09/11/2016 1335      Wt Readings from Last 3 Encounters:  11/22/17 229 lb (103.9 kg)  07/02/17 218 lb 8 oz (99.1 kg)  03/02/17 225 lb 6.4 oz (102.2 kg)      ASSESSMENT AND PLAN:  1  Dyslipidemia  WIll check labs today    2  HTN  BP is OK today  3  Coronary calcifications   Ca Score is 17   Minimal plaquing  Keep on statin    4  Pulmonary  CTs have shown small pulmonary nodules that have been stable   He had one on recent CT that was not in image windows from previous   Cant tell if there before I would prob follow, not perform separate CT    F/U with pt's labs  Otherwise f/u in 1 year    Will check lipomed   F/U in 1 year  Encouraged him to stay active   Current medicines are reviewed at length with the patient today.  The patient does not have concerns regarding medicines.  Signed, Dietrich Pates, MD  11/22/2017 10:51 AM    [facility] [address] [place], [place], [place]  [address] Phone: [phone]; Fax: [phone]

## 2017-11-23 LAB — APOLIPOPROTEIN B: Apolipoprotein B: 77 mg/dL (ref <90)

## 2017-11-23 LAB — NMR, LIPOPROFILE
Cholesterol, Total: 146 mg/dL (ref 100–199)
HDL Particle Number: 29.2 umol/L — ABNORMAL LOW (ref >=30.5)
HDL-C: 44 mg/dL (ref >39)
LDL PARTICLE NUMBER: 1135 nmol/L — AB (ref <1000)
LDL Size: 20.1 nm — ABNORMAL LOW (ref >20.5)
LDL-C: 75 mg/dL (ref 0–99)
LP-IR SCORE: 56 — AB (ref <=45)
Small LDL Particle Number: 804 nmol/L — ABNORMAL HIGH (ref <=527)
Triglycerides: 135 mg/dL (ref 0–149)

## 2017-11-23 LAB — LIPOPROTEIN A (LPA): LIPOPROTEIN (A): 7 nmol/L (ref <75)

## 2017-12-01 NOTE — Addendum Note (Signed)
Addended by: Solon Augusta on: 12/01/2017 06:01 PM   Modules accepted: Orders

## 2017-12-02 NOTE — Addendum Note (Signed)
Addended by: Solon Augusta on: 12/02/2017 11:43 AM   Modules accepted: Orders

## 2017-12-21 ENCOUNTER — Ambulatory Visit (INDEPENDENT_AMBULATORY_CARE_PROVIDER_SITE_OTHER): Payer: 59 | Admitting: *Deleted

## 2017-12-21 DIAGNOSIS — J309 Allergic rhinitis, unspecified: Secondary | ICD-10-CM | POA: Diagnosis not present

## 2018-01-04 ENCOUNTER — Other Ambulatory Visit: Payer: Self-pay | Admitting: Internal Medicine

## 2018-01-17 ENCOUNTER — Ambulatory Visit (INDEPENDENT_AMBULATORY_CARE_PROVIDER_SITE_OTHER): Payer: 59 | Admitting: *Deleted

## 2018-01-17 DIAGNOSIS — J309 Allergic rhinitis, unspecified: Secondary | ICD-10-CM | POA: Diagnosis not present

## 2018-02-21 ENCOUNTER — Ambulatory Visit (INDEPENDENT_AMBULATORY_CARE_PROVIDER_SITE_OTHER): Payer: 59

## 2018-02-21 DIAGNOSIS — J309 Allergic rhinitis, unspecified: Secondary | ICD-10-CM

## 2018-03-25 ENCOUNTER — Ambulatory Visit (INDEPENDENT_AMBULATORY_CARE_PROVIDER_SITE_OTHER): Payer: 59

## 2018-03-25 DIAGNOSIS — J309 Allergic rhinitis, unspecified: Secondary | ICD-10-CM

## 2018-04-26 ENCOUNTER — Ambulatory Visit (INDEPENDENT_AMBULATORY_CARE_PROVIDER_SITE_OTHER): Payer: 59 | Admitting: *Deleted

## 2018-04-26 DIAGNOSIS — J309 Allergic rhinitis, unspecified: Secondary | ICD-10-CM

## 2018-05-04 NOTE — Progress Notes (Signed)
Vials exp 05-05-19 

## 2018-05-05 DIAGNOSIS — J301 Allergic rhinitis due to pollen: Secondary | ICD-10-CM

## 2018-06-05 IMAGING — CT CT HEART SCORING
2 series · 16 of 20 positions shown, 18 images · non-contrast
Comparison: None.

CLINICAL DATA: Risk stratification

EXAM:
Coronary Calcium Score
TECHNIQUE: The patient was scanned on a Siemens Somatom 64 slice scanner. Axial
non-contrast 3 mm slices were carried out through the heart. The
data set was analyzed on a dedicated work station and scored using
the Agatson method.
CLINICAL DATA: The following report is an over-read performed by
09/11/2016. This over-read does not include interpretation of cardiac
or coronary anatomy or pathology. The CTA interpretation by the
cardiologist is attached.

[Series 2: casc 3.0 i36f 2 bestdiast 71 % · axial · 0.42mm/px · z∈[-304,-214]mm · 8 of 40 slices shown, 10 images]
[im 5/40  vessel]
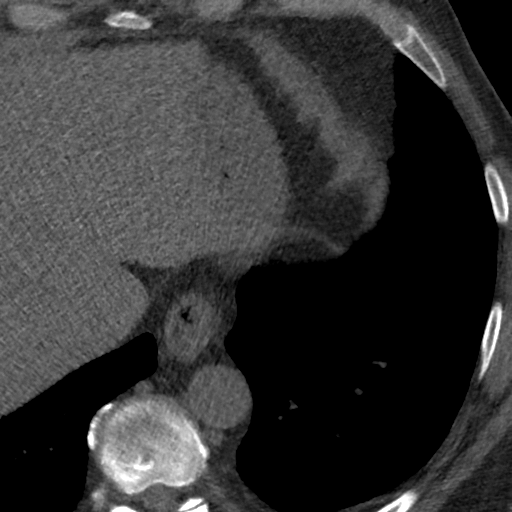
[im 5/40  lung]
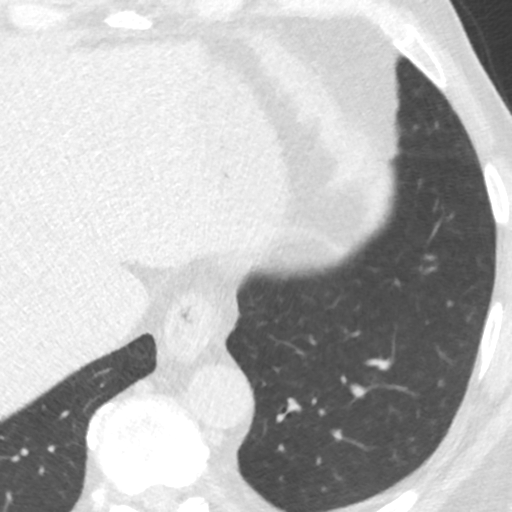
[im 9/40  vessel]
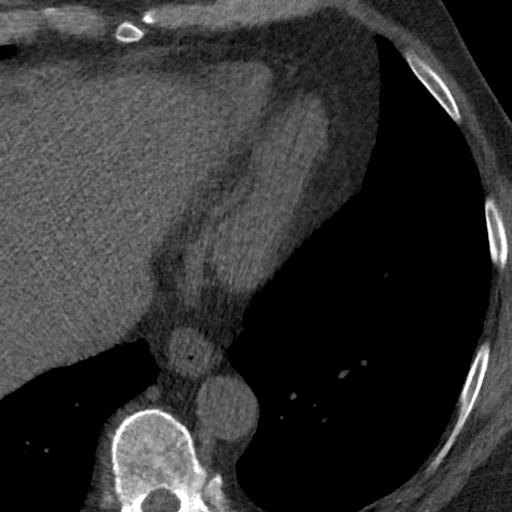
[im 14/40  vessel]
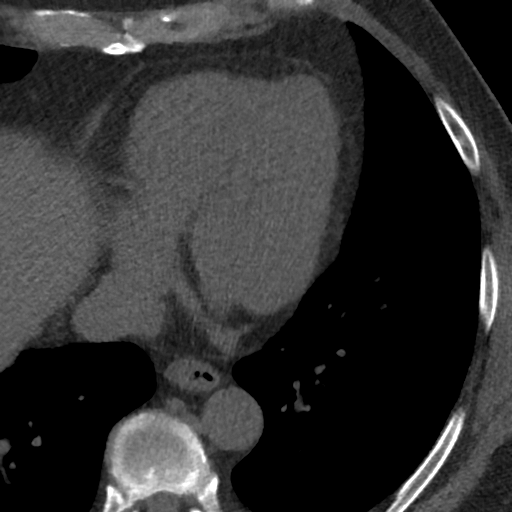
[im 18/40  vessel]
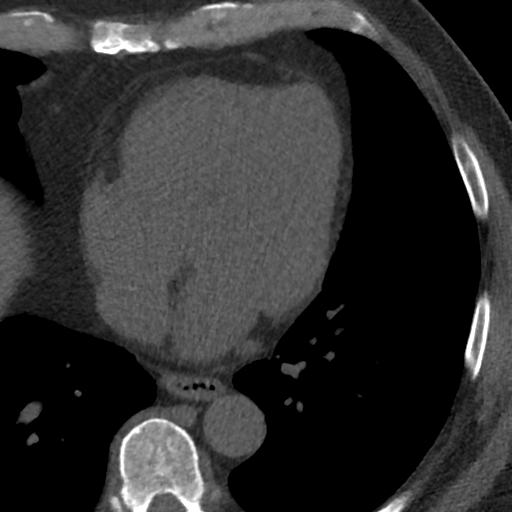
[im 22/40  vessel]
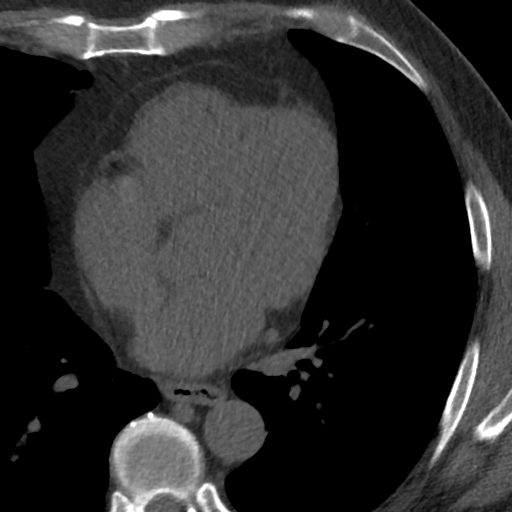
[im 22/40  lung]
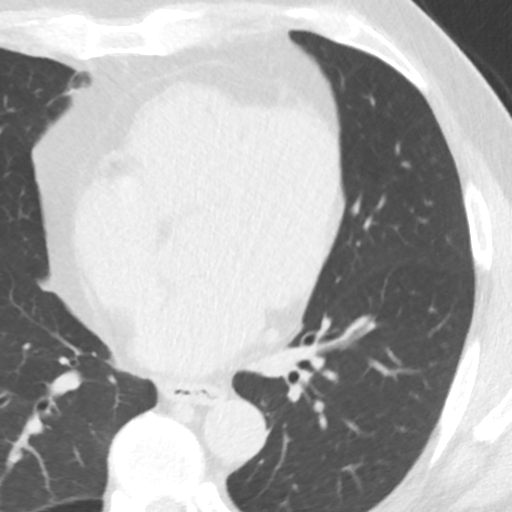
[im 27/40  vessel]
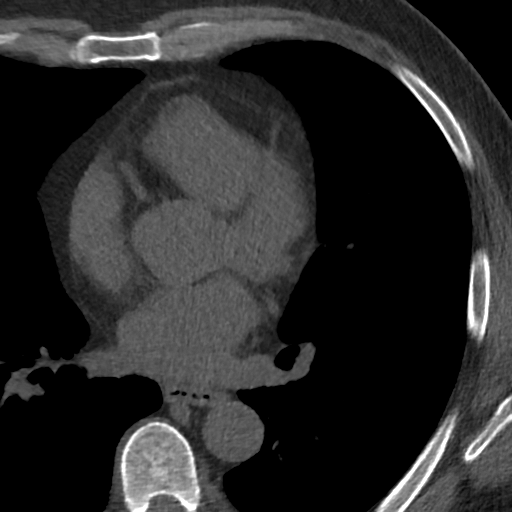
[im 31/40  vessel]
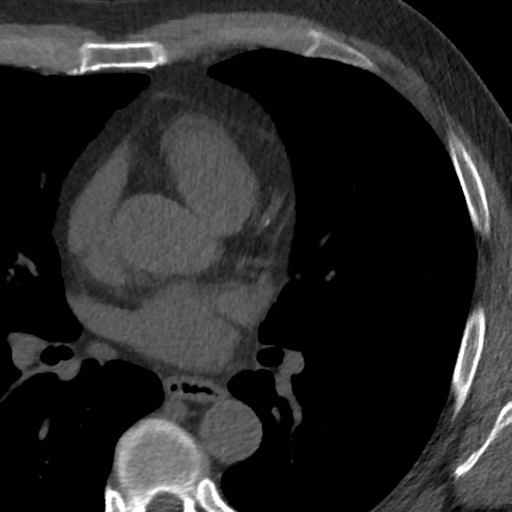
[im 35/40  vessel]
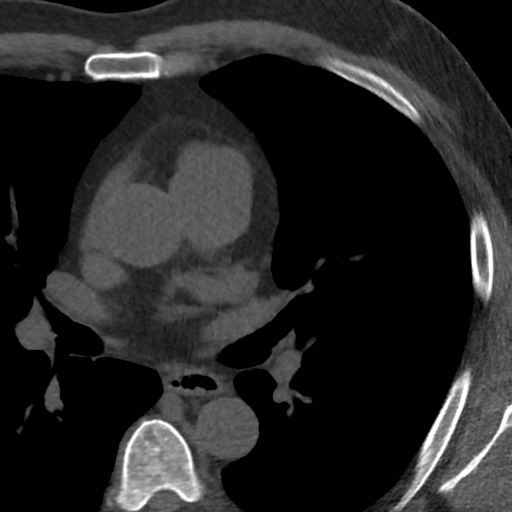

[Series 4: lung st 72 % · axial · 0.71mm/px · z∈[-304,-214]mm · 8 of 40 slices shown]
[im 5/40  lung]
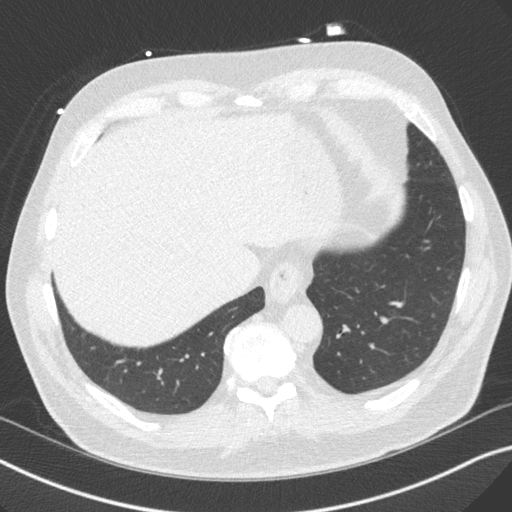
[im 9/40  lung]
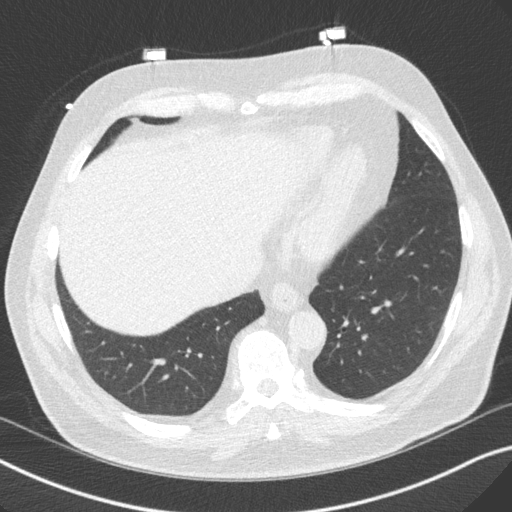
[im 14/40  lung]
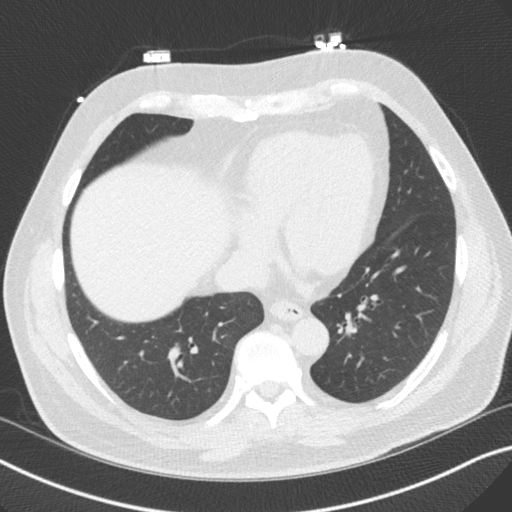
[im 18/40  lung]
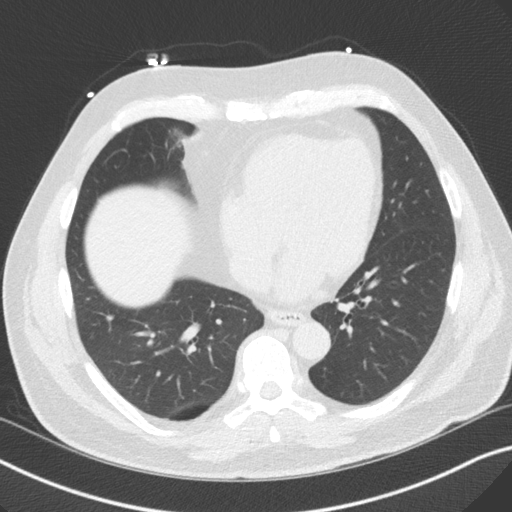
[im 22/40  lung]
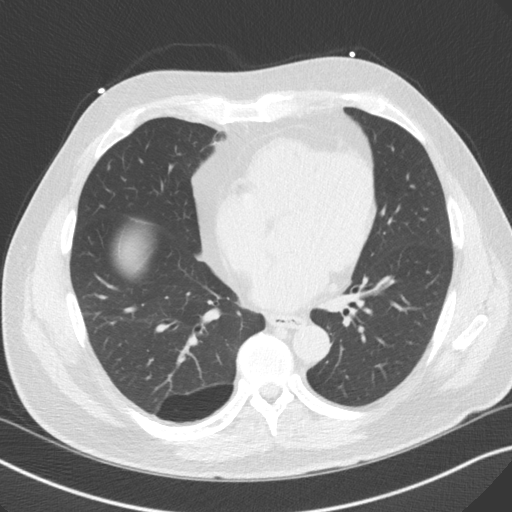
[im 27/40  lung]
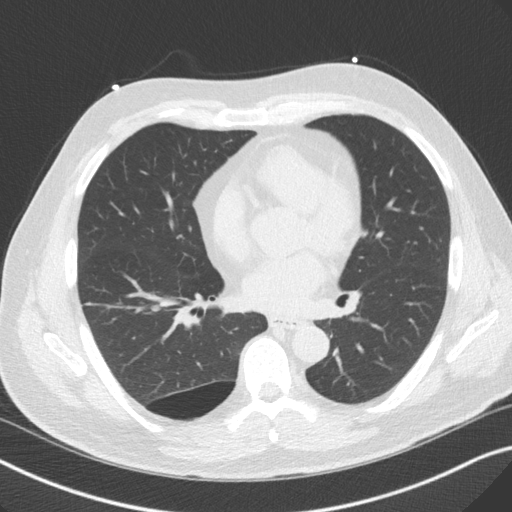
[im 31/40  lung]
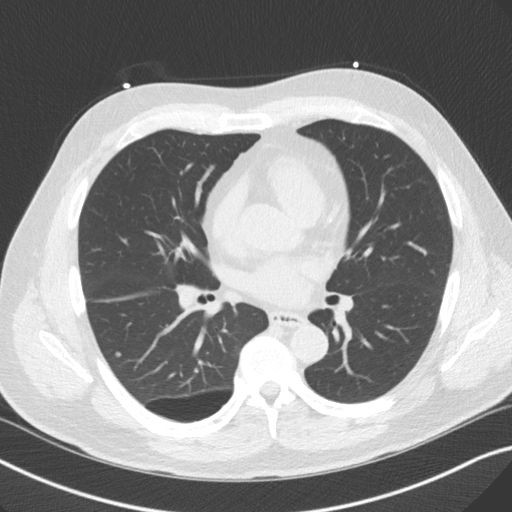
[im 35/40  lung]
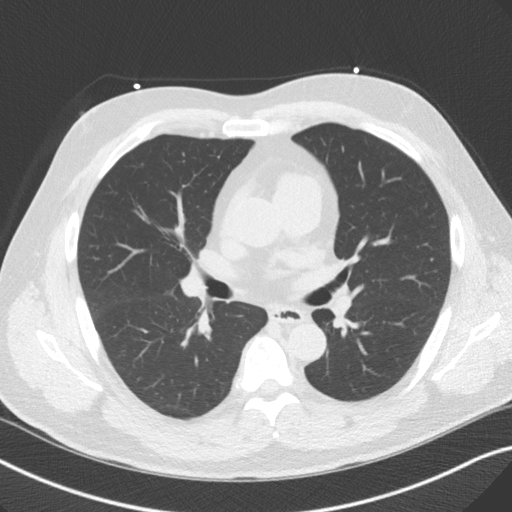

[16 of 20 positions shown; findings below may reference images not displayed]

FINDINGS: Non-cardiac: See separate report from [REDACTED].

Ascending Aorta:  3.3 cm

Pericardium: Normal

Coronary arteries:  Isolated area of calcium in the mid LAD
IMPRESSION: Coronary calcium score of 17 . This was in the 25th percentile for
age and sex matched control.

See radiology note regarding need for full non contrast CT for lung
nodules

Styles Parish

ADDENDUM:
I reviewed this study with Dr. Barrueta on 09/14/2016. Patient has no
risk factors for bronchogenic carcinoma. The visible nodules are not
morphologically suspicious and no other nodules are apparent on the
scout image. Therefore, appropriate follow-up is noncontrast chest
CT in 1 year. This recommendation follows the consensus statement:
Guidelines for Management of Small Pulmonary Nodules Detected on CT
Images: From the [HOSPITAL] 7578; Radiology 7578;
[DATE].
FINDINGS: Visualized lung fields extend from just below the carina to above
the diaphragms. There are several small noncalcified pulmonary
nodules visualized. 4 mm peripheral right lower lobe nodule is seen
on image number 10 of the full field of view lung windows. There are
3 nodules in the peripheral left lower lobe measuring 4 mm, 4 mm and
5 mm respectively in diameter.

No visualized enlarged lymph nodes, pleural effusions or bony
abnormalities. Emphysematous bulla is present posteriorly on the
right.
IMPRESSION: A total of 4 small peripheral pulmonary nodules are identified in
right and left lower lobes, as above. As this only represents a
limited section of the entire lungs, a formal CT of the entire chest
is recommended as a baseline for further comparison studies.

## 2018-06-17 ENCOUNTER — Ambulatory Visit (INDEPENDENT_AMBULATORY_CARE_PROVIDER_SITE_OTHER): Payer: 59 | Admitting: *Deleted

## 2018-06-17 DIAGNOSIS — J309 Allergic rhinitis, unspecified: Secondary | ICD-10-CM

## 2018-06-27 ENCOUNTER — Other Ambulatory Visit: Payer: Self-pay | Admitting: Allergy and Immunology

## 2018-06-28 ENCOUNTER — Ambulatory Visit (INDEPENDENT_AMBULATORY_CARE_PROVIDER_SITE_OTHER): Payer: 59 | Admitting: *Deleted

## 2018-06-28 DIAGNOSIS — J309 Allergic rhinitis, unspecified: Secondary | ICD-10-CM | POA: Diagnosis not present

## 2018-07-11 ENCOUNTER — Ambulatory Visit (INDEPENDENT_AMBULATORY_CARE_PROVIDER_SITE_OTHER): Payer: 59 | Admitting: *Deleted

## 2018-07-11 DIAGNOSIS — J309 Allergic rhinitis, unspecified: Secondary | ICD-10-CM

## 2018-07-18 ENCOUNTER — Other Ambulatory Visit: Payer: Self-pay | Admitting: Allergy and Immunology

## 2018-08-08 ENCOUNTER — Ambulatory Visit (INDEPENDENT_AMBULATORY_CARE_PROVIDER_SITE_OTHER): Payer: BLUE CROSS/BLUE SHIELD

## 2018-08-08 DIAGNOSIS — J309 Allergic rhinitis, unspecified: Secondary | ICD-10-CM

## 2018-08-31 ENCOUNTER — Ambulatory Visit (INDEPENDENT_AMBULATORY_CARE_PROVIDER_SITE_OTHER): Payer: BLUE CROSS/BLUE SHIELD

## 2018-08-31 ENCOUNTER — Other Ambulatory Visit: Payer: Self-pay | Admitting: Allergy and Immunology

## 2018-08-31 ENCOUNTER — Telehealth: Payer: Self-pay | Admitting: Allergy and Immunology

## 2018-08-31 DIAGNOSIS — J309 Allergic rhinitis, unspecified: Secondary | ICD-10-CM | POA: Diagnosis not present

## 2018-08-31 MED ORDER — AZELASTINE-FLUTICASONE 137-50 MCG/ACT NA SUSP: NASAL | 0 refills | Status: DC

## 2018-08-31 NOTE — Telephone Encounter (Signed)
Pt came to my window and needs to get a refill on his Dymista . He has appointment on 09/27/2018 with dr Lucie Leather and is going out of town tomorrow, a would like to have it as soon as possible. [address] [phone].

## 2018-08-31 NOTE — Telephone Encounter (Signed)
Tyler Taylor for patient advising that I was sending in a courtesy refill due to it being difficult to get an appointment with Dr. Lucie Leather in [place].  Informed patient via VM that he must keep this appointment if he would like to receive future refills.

## 2018-09-27 ENCOUNTER — Encounter: Payer: Self-pay | Admitting: Allergy and Immunology

## 2018-09-27 ENCOUNTER — Ambulatory Visit: Payer: Self-pay | Admitting: *Deleted

## 2018-09-27 ENCOUNTER — Ambulatory Visit (INDEPENDENT_AMBULATORY_CARE_PROVIDER_SITE_OTHER): Payer: BLUE CROSS/BLUE SHIELD | Admitting: Allergy and Immunology

## 2018-09-27 VITALS — BP 136/78 | HR 84 | Resp 16 | Ht 73.0 in | Wt 227.0 lb

## 2018-09-27 DIAGNOSIS — J3089 Other allergic rhinitis: Secondary | ICD-10-CM | POA: Diagnosis not present

## 2018-09-27 DIAGNOSIS — Z298 Encounter for other specified prophylactic measures: Secondary | ICD-10-CM

## 2018-09-27 DIAGNOSIS — K219 Gastro-esophageal reflux disease without esophagitis: Secondary | ICD-10-CM | POA: Diagnosis not present

## 2018-09-27 MED ORDER — CHLOROQUINE PHOSPHATE 500 MG PO TABS: ORAL_TABLET | ORAL | 0 refills | Status: DC

## 2018-09-27 MED ORDER — AZELASTINE-FLUTICASONE 137-50 MCG/ACT NA SUSP: NASAL | 5 refills | Status: DC

## 2018-09-27 NOTE — Patient Instructions (Signed)
  1. Continue immunotherapy and (EpiPen) AUVI-Q 0.3  2. Continue Dymista one spray each nostril twice a day  3. Continue omeprazole 40 mg in a.m. plus ranitidine 300 mg in PM  4. Refill preventative chloroquine prescription  5. Return to clinic in 1 year or earlier if problem

## 2018-09-27 NOTE — Progress Notes (Signed)
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 male who [facility] in re-evaluation of the following:  HPI: Tyler Taylor returns to this clinic in reevaluation of his allergic rhinitis and LPR.  His last visit to this clinic was 02 March 2017.  Once again he has done excellent with his airway and has not required a systemic steroid or antibiotic.  He continues on immunotherapy every 4 weeks without any adverse effect.  He continues on Dymista twice a day.  Reflux is under good control at this point and has had very little issues with his throat as long as he remains on a combination of a proton pump inhibitor and H2 receptor blocker.  He will be visiting [place] and would like prophylaxis for malaria as we have provided for him in the past.  He did obtain the flu vaccine this year.  Allergies as of 09/27/2018   No Known Allergies     Medication List      Azelastine-Fluticasone 137-50 MCG/ACT Susp Commonly known as:  DYMISTA PLACE 1-2 SPRAYS INTO BOTH NOSTRILS 2 (TWO) TIMES DAILY.   chloroquine 500 MG tablet Commonly known as:  ARALEN Take 1 tablet weekly, begin 1 week prior to travel and complete 4 weeks after travel.   diphenhydrAMINE 50 MG tablet Commonly known as:  BENADRYL Take 50 mg by mouth at bedtime.   docusate sodium 100 MG capsule Commonly known as:  COLACE Take 1 capsule (100 mg total) by mouth 2 (two) times daily.   famotidine 20 MG tablet Commonly known as:  PEPCID Take 20 mg by mouth 2 (two) times daily.   loratadine 10 MG tablet Commonly known as:  CLARITIN Take 10 mg by mouth daily.   multivitamin with minerals Tabs tablet Take 1 tablet by mouth daily.   omeprazole 40 MG capsule Commonly  known as:  PRILOSEC Take 40 mg by mouth daily.   pravastatin 80 MG tablet Commonly known as:  PRAVACHOL TAKE 1 TABLET (80 MG TOTAL) BY MOUTH EVERY EVENING.   pseudoephedrine-guaifenesin 60-600 MG 12 hr tablet Commonly known as:  MUCINEX D Take 0.5 tablets by mouth daily.       Past Medical History:  Diagnosis Date  . Complication of anesthesia   . Family history of adverse reaction to anesthesia    severe ponv mother  . GERD (gastroesophageal reflux disease)   . Hemorrhoids   . Hiatal hernia   . Hyperlipemia, mixed   . Nocturia   . Pneumonia April 2013   walking  . PONV (postoperative nausea and vomiting)    likes scopolamine patch and propofol helped with last surgery  . Seasonal allergic rhinitis   . SOB (shortness of breath) 15 yrs ago  . Tension pneumothorax 2002   from diving    Past Surgical History:  Procedure Laterality Date  . APPENDECTOMY    . GANGLION CYST EXCISION     left wrist  . HEMORRHOID SURGERY N/A 07/02/2017   Procedure: SINGLE COLUMN HEMORRHOIDECTOMY;  Surgeon: Romie Levee, MD;  Location: [facility];  Service: General;  Laterality: N/A;  . SHOULDER SURGERY  09/2015   rotator cuff reattach superspinatour and bone spur removal  . TONSILLECTOMY    . TRANSANAL  HEMORRHOIDAL DEARTERIALIZATION  07/22/2012   Procedure: TRANSANAL HEMORRHOIDAL DEARTERIALIZATION;  Surgeon: Romie Levee, MD;  Location: [facility];  Service: General;  Laterality: N/A;    Review of systems negative except as noted in HPI / PMHx or noted below:  Review of Systems  Constitutional: Negative.   HENT: Negative.   Eyes: Negative.   Respiratory: Negative.   Cardiovascular: Negative.   Gastrointestinal: Negative.   Genitourinary: Negative.   Musculoskeletal: Negative.   Skin: Negative.   Neurological: Negative.   Endo/Heme/Allergies: Negative.   Psychiatric/Behavioral: Negative.      Objective:   Vitals:   09/27/18 1115  BP: 136/78  Pulse: 84  Resp:  16  SpO2: 96%   Height: 6\' 1"  (185.4 cm)  Weight: 227 lb (103 kg)   Physical Exam Constitutional:      Appearance: He is not diaphoretic.  HENT:     Head: Normocephalic.     Right Ear: Tympanic membrane, ear canal and external ear normal.     Left Ear: Tympanic membrane, ear canal and external ear normal.     Nose: Nose normal. No mucosal edema or rhinorrhea.     Mouth/Throat:     Pharynx: Uvula midline. No oropharyngeal exudate.  Eyes:     Conjunctiva/sclera: Conjunctivae normal.  Neck:     Thyroid: No thyromegaly.     Trachea: Trachea normal. No tracheal tenderness or tracheal deviation.  Cardiovascular:     Rate and Rhythm: Normal rate and regular rhythm.     Heart sounds: Normal heart sounds, S1 normal and S2 normal. No murmur.  Pulmonary:     Effort: No respiratory distress.     Breath sounds: Normal breath sounds. No stridor. No wheezing or rales.  Lymphadenopathy:     Head:     Right side of head: No tonsillar adenopathy.     Left side of head: No tonsillar adenopathy.     Cervical: No cervical adenopathy.  Skin:    Findings: No erythema or rash.     Nails: There is no clubbing.   Neurological:     Mental Status: He is alert.     Diagnostics: none  Assessment and Plan:   1. Other allergic rhinitis   2. LPRD (laryngopharyngeal reflux disease)   3. Need for malaria prophylaxis     1. Continue immunotherapy and (EpiPen) AUVI-Q 0.3  2. Continue Dymista one spray each nostril twice a day  3. Continue omeprazole 40 mg in a.m. plus ranitidine 300 mg in PM  4. Refill preventative chloroquine prescription  5. Return to clinic in 1 year or earlier if problem  Tyler Taylor is really doing quite well and he will continue on immunotherapy and therapy directed against LPR and I will see him back in this clinic in 1 year or earlier if there is a problem.  We did provide him his annual malaria prophylaxis prescription.  Tyler Schimke, MD Allergy / Immunology [place]  [facility]

## 2018-09-28 ENCOUNTER — Encounter: Payer: Self-pay | Admitting: Allergy and Immunology

## 2018-11-30 ENCOUNTER — Telehealth: Payer: Self-pay | Admitting: Allergy and Immunology

## 2018-11-30 ENCOUNTER — Ambulatory Visit (INDEPENDENT_AMBULATORY_CARE_PROVIDER_SITE_OTHER): Payer: BLUE CROSS/BLUE SHIELD | Admitting: *Deleted

## 2018-11-30 DIAGNOSIS — J309 Allergic rhinitis, unspecified: Secondary | ICD-10-CM | POA: Diagnosis not present

## 2018-11-30 MED ORDER — EPINEPHRINE 0.3 MG/0.3ML IJ SOAJ: 0.3000 mg | INTRAMUSCULAR | 2 refills | Status: DC | PRN

## 2018-11-30 NOTE — Telephone Encounter (Signed)
Medication sent to [facility] on [address].

## 2018-11-30 NOTE — Telephone Encounter (Signed)
Patient needs a new script for EPI-PEN [facility] to [facility] on [address] Please call pt if there are any questions

## 2018-12-06 ENCOUNTER — Ambulatory Visit (INDEPENDENT_AMBULATORY_CARE_PROVIDER_SITE_OTHER): Payer: BLUE CROSS/BLUE SHIELD

## 2018-12-06 DIAGNOSIS — J309 Allergic rhinitis, unspecified: Secondary | ICD-10-CM | POA: Diagnosis not present

## 2018-12-15 ENCOUNTER — Telehealth: Payer: Self-pay | Admitting: Internal Medicine

## 2018-12-15 NOTE — Telephone Encounter (Signed)
Pt. Was almost not happy about the switch to virtual. He says that he needs blood work and other tests done. I told him that I would let the nurse know so she could call him. He then said whatever Dr. Tenny Craw wants him to do is fine.

## 2018-12-16 NOTE — Telephone Encounter (Signed)
Will route to Dr. Tenny Craw to see if she would want any labs done prior to virtual visit.

## 2018-12-22 NOTE — Telephone Encounter (Signed)
Pull labs from [place]   If he has not had lipids in last 6 months then I should get a lipomed with LDL  Confirm, I can see him in person

## 2018-12-23 NOTE — Telephone Encounter (Signed)
Called patient and switched back to in office visit.  Message to chart prep to request labs from PCP:  Ileene Musa, RN        Called Dr. Modesto Charon office and pt has never been in to see Dr. Modesto Charon and they do not have any records that they can send [place].   Lynden Ang, CMA

## 2018-12-26 ENCOUNTER — Other Ambulatory Visit: Payer: Self-pay

## 2018-12-26 ENCOUNTER — Encounter: Payer: Self-pay | Admitting: Internal Medicine

## 2018-12-26 ENCOUNTER — Ambulatory Visit (INDEPENDENT_AMBULATORY_CARE_PROVIDER_SITE_OTHER): Payer: BLUE CROSS/BLUE SHIELD | Admitting: Internal Medicine

## 2018-12-26 ENCOUNTER — Telehealth: Payer: BLUE CROSS/BLUE SHIELD | Admitting: Internal Medicine

## 2018-12-26 VITALS — BP 139/91 | HR 75 | Ht 73.0 in | Wt 228.6 lb

## 2018-12-26 DIAGNOSIS — E785 Hyperlipidemia, unspecified: Secondary | ICD-10-CM

## 2018-12-26 DIAGNOSIS — R3911 Hesitancy of micturition: Secondary | ICD-10-CM | POA: Diagnosis not present

## 2018-12-26 DIAGNOSIS — I10 Essential (primary) hypertension: Secondary | ICD-10-CM | POA: Diagnosis not present

## 2018-12-26 MED ORDER — CHLOROQUINE PHOSPHATE 500 MG PO TABS: ORAL_TABLET | ORAL | 0 refills | Status: DC

## 2018-12-26 NOTE — Progress Notes (Signed)
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 with a history of  Hyperlipidemia   I saw him for the first time last spring   He had been  foloowed by Geanie Kenning at [facility] in past  Has been there for years  Had GXT in remote past (1995) for CP  Negative  Placed on statin in 1990s  Had CP in 2000s     When I saw him initially I recomm he get a calcium score CT   His score was 17   CT also showed some small pulmonary nondules   F/U CT unchanged (though a new part of lung imaged on last screen  Small    Initially I recomm tighter contol of lipids and  Increased pravastatin to 80  Repeat lipomed showed very small drop in particles  With this I recomm he switch to Crestor  Unfortunately he developed muscle aches on the Cretor   Went back to 80 mg daily pravstatin  SInce I saw the pt in clinic last he has done well  No CP   Breathing is OK  Does not check BP often  Current Meds  Medication Sig  . Azelastine-Fluticasone (DYMISTA) 137-50 MCG/ACT SUSP PLACE 1-2 SPRAYS INTO BOTH NOSTRILS 2 (TWO) TIMES DAILY.  . chloroquine (ARALEN) 500 MG tablet Take 1 tablet weekly, begin 1 week prior to travel and complete 4 weeks after travel.  . diphenhydrAMINE (BENADRYL) 50 MG tablet Take 50 mg by mouth at bedtime.  . docusate sodium (COLACE) 100 MG capsule Take 1 capsule (100 mg total) by mouth 2 (two) times daily. (Patient taking differently: Take 100 mg by mouth daily. )  . EPINEPHrine (EPIPEN 2-PAK) 0.3 mg/0.3 mL IJ SOAJ injection Inject 0.3 mLs (0.3 mg total) into the muscle as needed for anaphylaxis.  . famotidine (PEPCID) 20 MG tablet Take 20 mg by mouth 2 (two) times daily.  Marland Kitchen loratadine (CLARITIN) 10 MG tablet Take 10 mg by mouth daily.   . Multiple Vitamin (MULTIVITAMIN WITH MINERALS) TABS Take 1 tablet by mouth daily.  Marland Kitchen omeprazole (PRILOSEC) 40 MG capsule Take 40 mg by mouth daily.  . pravastatin (PRAVACHOL) 80 MG tablet TAKE 1 TABLET (80 MG TOTAL) BY MOUTH EVERY EVENING.  Marland Kitchen pseudoephedrine-guaifenesin (MUCINEX D) 60-600 MG per tablet Take 0.5 tablets by mouth daily.      Allergies:   Patient has no known allergies.   Past Medical History:  Diagnosis Date  . Complication of anesthesia   . Family history of adverse reaction to anesthesia    severe ponv mother  . GERD (gastroesophageal reflux disease)   . Hemorrhoids   . Hiatal hernia   . Hyperlipemia, mixed   . Nocturia   . Pneumonia April 2013   walking  . PONV (postoperative nausea and vomiting)    likes scopolamine patch and propofol helped with last surgery  . Seasonal allergic rhinitis   . SOB (shortness of breath) 15 yrs ago  . Tension pneumothorax 2002   from diving    Past Surgical History:  Procedure Laterality Date  . APPENDECTOMY    . GANGLION CYST EXCISION     left wrist  .  HEMORRHOID SURGERY N/A 07/02/2017   Procedure: SINGLE COLUMN HEMORRHOIDECTOMY;  Surgeon: Romie Leveehomas, Alicia, MD;  [facility];  Service: General;  Laterality: N/A;  . SHOULDER SURGERY  09/2015   rotator cuff reattach superspinatour and bone spur removal  . TONSILLECTOMY    . TRANSANAL HEMORRHOIDAL DEARTERIALIZATION  07/22/2012   Procedure: TRANSANAL HEMORRHOIDAL DEARTERIALIZATION;  Surgeon: Romie LeveeAlicia Thomas, MD;  Location: [facility];  Service: General;  Laterality: N/A;     Social History:  The patient  reports that he has never smoked. He has never used smokeless tobacco. He reports that he does not drink alcohol or use drugs.   Family History:  The patient's family history includes Melanoma in his mother.    ROS:  Please see the history of present illness. All other systems are reviewed and  Negative to the above problem except as noted.     PHYSICAL EXAM: VS:  BP (!) 139/91   Pulse 75   Ht 6\' 1"  (1.854 m)   Wt 228 lb 9.6 oz (103.7 kg)   BMI 30.16 kg/m     GEN: Well nourished, well developed, in no acute distress  HEENT: normal  Neck: JVP is normal  NO carotid bruits, or masses Cardiac: RRR; no murmurs, rubs, or gallops,no edema  Respiratory:  clear to auscultation bilaterally, normal work of breathing GI: soft, nontender, nondistended, + BS  No hepatomegaly  MS: no deformity Moving all extremities   Skin: warm and dry, no rash Neuro:  Strength and sensation are intact Psych: euthymic mood, full affect   EKG:  EKG is not ordered today.   Lipid Panel    Component Value Date/Time   CHOL 165 12/15/2016 0833   CHOL 160 09/11/2016 1335   TRIG 130 12/15/2016 0833   HDL 46 12/15/2016 0833   CHOLHDL 3.9 09/11/2016 1335   LDLCALC 94 09/11/2016 1335      Wt Readings from Last 3 Encounters:  12/26/18 228 lb 9.6 oz (103.7 kg)  09/27/18 227 lb (103 kg)  11/22/17 229 lb (103.9 kg)      ASSESSMENT AND PLAN:  1  Dyslipidemia  WIll set patient up for fasting lipids   He at today    2  HTN  BP is very mildly increased   He has a history of higher BP in clinic I have asked him to check intermittently at home      3  Coronary calcifications   Ca Score is 17   Minimal plaquing  Keep on statin    4  Pulmonary nodules  Last CT new sections showed small  (5 mm) nodule ( had not been imaged previously   Otherwise nodules stable NOt plan f/u  If no symptoms  Check lipomed, CBC, BMET and PAS  Otherwise f/u in 1 year      F/U in 1 year  Encouraged him to stay active   Current medicines are reviewed at length with the patient today.  The patient does not have concerns regarding medicines.  Signed, Dietrich PatesPaula Carinne Brandenburger, MD  12/26/2018 4:24 PM    [facility] [address], [place], [place]  [address] Phone: [phone]; Fax: [phone]

## 2018-12-26 NOTE — Patient Instructions (Signed)
Medication Instructions:  No changes If you need a refill on your cardiac medications before your next appointment, please call your pharmacy.   Lab work: Come back for fasting labs --cbc, bmet, lipomed panel, tsh, psa If you have labs (blood work) drawn today and your tests are completely normal, you will receive your results only by: Marland Kitchen MyChart Message (if you have MyChart) OR . A paper copy in the mail If you have any lab test that is abnormal or we need to change your treatment, we will call you to review the results.  Testing/Procedures: none  Follow-Up: At [facility], you and your health needs are our priority.  As part of our continuing mission to provide you with exceptional heart care, we have created designated Provider Care Teams.  These Care Teams include your primary Cardiologist (physician) and Advanced Practice Providers (APPs -  Physician Assistants and Nurse Practitioners) who all work together to provide you with the care you need, when you need it. You will need a follow up appointment in:  12 months.  Please call our office 2 months in advance to schedule this appointment.  You may see Dr. Tenny Craw or one of the following Advanced Practice Providers on your designated Care Team: Tereso Newcomer, PA-C [place], [place] . Berton Bon, NP  Any Other Special Instructions Will Be Listed Below (If Applicable).

## 2018-12-28 ENCOUNTER — Other Ambulatory Visit: Payer: Self-pay | Admitting: Internal Medicine

## 2019-01-03 ENCOUNTER — Ambulatory Visit (INDEPENDENT_AMBULATORY_CARE_PROVIDER_SITE_OTHER): Payer: BLUE CROSS/BLUE SHIELD | Admitting: *Deleted

## 2019-01-03 ENCOUNTER — Other Ambulatory Visit: Payer: BLUE CROSS/BLUE SHIELD

## 2019-01-03 ENCOUNTER — Other Ambulatory Visit: Payer: Self-pay

## 2019-01-03 DIAGNOSIS — J309 Allergic rhinitis, unspecified: Secondary | ICD-10-CM | POA: Diagnosis not present

## 2019-01-03 DIAGNOSIS — E785 Hyperlipidemia, unspecified: Secondary | ICD-10-CM | POA: Diagnosis not present

## 2019-01-03 DIAGNOSIS — R3911 Hesitancy of micturition: Secondary | ICD-10-CM

## 2019-01-04 LAB — BASIC METABOLIC PANEL
BUN/Creatinine Ratio: 11 (ref 10–24)
BUN: 14 mg/dL (ref 8–27)
CO2: 22 mmol/L (ref 20–29)
Calcium: 9.3 mg/dL (ref 8.6–10.2)
Chloride: 106 mmol/L (ref 96–106)
Creatinine, Ser: 1.27 mg/dL (ref 0.76–1.27)
GFR calc Af Amer: 69 mL/min/{1.73_m2} (ref >59)
GFR calc non Af Amer: 59 mL/min/{1.73_m2} — ABNORMAL LOW (ref >59)
Glucose: 111 mg/dL — ABNORMAL HIGH (ref 65–99)
Potassium: 4.1 mmol/L (ref 3.5–5.2)
Sodium: 138 mmol/L (ref 134–144)

## 2019-01-04 LAB — CBC
Hematocrit: 41.2 % (ref 37.5–51.0)
Hemoglobin: 14.3 g/dL (ref 13.0–17.7)
MCH: 31.7 pg (ref 26.6–33.0)
MCHC: 34.7 g/dL (ref 31.5–35.7)
MCV: 91 fL (ref 79–97)
Platelets: 205 10*3/uL (ref 150–450)
RBC: 4.51 x10E6/uL (ref 4.14–5.80)
RDW: 12.7 % (ref 11.6–15.4)
WBC: 6.8 10*3/uL (ref 3.4–10.8)

## 2019-01-04 LAB — NMR, LIPOPROFILE
Cholesterol, Total: 167 mg/dL (ref 100–199)
HDL Particle Number: 33.9 umol/L (ref >=30.5)
HDL-C: 42 mg/dL (ref >39)
LDL Particle Number: 1195 nmol/L — ABNORMAL HIGH (ref <1000)
LDL Size: 20.8 nm (ref >20.5)
LDL-C: 99 mg/dL (ref 0–99)
LP-IR Score: 64 — ABNORMAL HIGH (ref <=45)
Small LDL Particle Number: 450 nmol/L (ref <=527)
Triglycerides: 128 mg/dL (ref 0–149)

## 2019-01-04 LAB — TSH: TSH: 2.26 u[IU]/mL (ref 0.450–4.500)

## 2019-01-04 LAB — APOLIPOPROTEIN B: Apolipoprotein B: 82 mg/dL (ref <90)

## 2019-01-04 LAB — LIPOPROTEIN A (LPA): Lipoprotein (a): 13.6 nmol/L (ref <75.0)

## 2019-01-04 LAB — PSA: Prostate Specific Ag, Serum: 1.3 ng/mL (ref 0.0–4.0)

## 2019-01-06 ENCOUNTER — Telehealth: Payer: Self-pay | Admitting: *Deleted

## 2019-01-06 DIAGNOSIS — E785 Hyperlipidemia, unspecified: Secondary | ICD-10-CM

## 2019-01-06 MED ORDER — EZETIMIBE 10 MG PO TABS: 10.0000 mg | ORAL_TABLET | Freq: Every day | ORAL | 3 refills | Status: DC

## 2019-01-06 NOTE — Telephone Encounter (Signed)
Order placed for zetia for 30 day supply. If tolerates will send 90 day supply. Needs labs to recheck lipids in 3 months. Orders placed.

## 2019-01-06 NOTE — Telephone Encounter (Signed)
-----   Message from Fay Records, MD sent at 01/06/2019 12:32 PM EDT ----- Reviewed labs with pt     Would recom trying Zetia 10 mg   Optimize LDL since has some plaquing on LAD Continue pravstatin   (Pt had problems with Vytorin and Crestor) F/U lipids in 3 months

## 2019-01-09 DIAGNOSIS — J301 Allergic rhinitis due to pollen: Secondary | ICD-10-CM | POA: Diagnosis not present

## 2019-01-09 NOTE — Progress Notes (Signed)
VIALS EXP 01-09-2020 

## 2019-01-31 ENCOUNTER — Ambulatory Visit (INDEPENDENT_AMBULATORY_CARE_PROVIDER_SITE_OTHER): Payer: BC Managed Care – PPO | Admitting: *Deleted

## 2019-01-31 DIAGNOSIS — J309 Allergic rhinitis, unspecified: Secondary | ICD-10-CM

## 2019-02-04 ENCOUNTER — Encounter: Payer: Self-pay | Admitting: Gastroenterology

## 2019-03-06 ENCOUNTER — Ambulatory Visit (INDEPENDENT_AMBULATORY_CARE_PROVIDER_SITE_OTHER): Payer: Medicare Other | Admitting: *Deleted

## 2019-03-06 DIAGNOSIS — J309 Allergic rhinitis, unspecified: Secondary | ICD-10-CM | POA: Diagnosis not present

## 2019-03-23 ENCOUNTER — Telehealth: Payer: Self-pay | Admitting: Internal Medicine

## 2019-03-23 ENCOUNTER — Other Ambulatory Visit: Payer: Self-pay

## 2019-03-23 MED ORDER — AMLODIPINE BESYLATE 2.5 MG PO TABS: 2.5000 mg | ORAL_TABLET | Freq: Every day | ORAL | 12 refills | Status: DC

## 2019-03-23 NOTE — Progress Notes (Signed)
lo

## 2019-03-23 NOTE — Telephone Encounter (Signed)
Spoke to patient about blood pressure    Needs something to lower  I would recomm amlodipine 2.5 mg  Daily Send 1 month wtith 12 refills to [facility] on [address] in [place] Pt now has [employer]

## 2019-03-27 ENCOUNTER — Other Ambulatory Visit: Payer: Self-pay

## 2019-03-27 ENCOUNTER — Other Ambulatory Visit: Payer: BLUE CROSS/BLUE SHIELD

## 2019-03-27 DIAGNOSIS — E785 Hyperlipidemia, unspecified: Secondary | ICD-10-CM

## 2019-03-28 LAB — NMR, LIPOPROFILE
Cholesterol, Total: 158 mg/dL (ref 100–199)
HDL Particle Number: 36.3 umol/L (ref >=30.5)
HDL-C: 48 mg/dL (ref >39)
LDL Particle Number: 987 nmol/L (ref <1000)
LDL Size: 20.6 nm (ref >20.5)
LDL-C (NIH Calc): 90 mg/dL (ref 0–99)
LP-IR Score: 55 — ABNORMAL HIGH (ref <=45)
Small LDL Particle Number: 425 nmol/L (ref <=527)
Triglycerides: 112 mg/dL (ref 0–149)

## 2019-03-28 LAB — LIPOPROTEIN A (LPA): Lipoprotein (a): <8.4 nmol/L (ref <75.0)

## 2019-03-28 LAB — APOLIPOPROTEIN B: Apolipoprotein B: 83 mg/dL (ref <90)

## 2019-04-02 ENCOUNTER — Other Ambulatory Visit: Payer: Self-pay | Admitting: Internal Medicine

## 2019-04-07 ENCOUNTER — Ambulatory Visit (INDEPENDENT_AMBULATORY_CARE_PROVIDER_SITE_OTHER): Payer: Medicare Other

## 2019-04-07 DIAGNOSIS — J309 Allergic rhinitis, unspecified: Secondary | ICD-10-CM | POA: Diagnosis not present

## 2019-05-02 ENCOUNTER — Ambulatory Visit (INDEPENDENT_AMBULATORY_CARE_PROVIDER_SITE_OTHER): Payer: Medicare Other

## 2019-05-02 DIAGNOSIS — J309 Allergic rhinitis, unspecified: Secondary | ICD-10-CM | POA: Diagnosis not present

## 2019-05-08 ENCOUNTER — Ambulatory Visit (INDEPENDENT_AMBULATORY_CARE_PROVIDER_SITE_OTHER): Payer: Medicare Other

## 2019-05-08 DIAGNOSIS — J309 Allergic rhinitis, unspecified: Secondary | ICD-10-CM | POA: Diagnosis not present

## 2019-05-25 ENCOUNTER — Ambulatory Visit (INDEPENDENT_AMBULATORY_CARE_PROVIDER_SITE_OTHER): Payer: Medicare Other

## 2019-05-25 DIAGNOSIS — J309 Allergic rhinitis, unspecified: Secondary | ICD-10-CM

## 2019-06-14 ENCOUNTER — Telehealth: Payer: Self-pay | Admitting: *Deleted

## 2019-06-14 MED ORDER — EZETIMIBE 10 MG PO TABS: 10.0000 mg | ORAL_TABLET | Freq: Every day | ORAL | 3 refills | Status: DC

## 2019-06-14 MED ORDER — PRAVASTATIN SODIUM 80 MG PO TABS: 80.0000 mg | ORAL_TABLET | Freq: Every evening | ORAL | 3 refills | Status: DC

## 2019-06-14 NOTE — Telephone Encounter (Signed)
Message received that patient needs zetia and pravastatin sent to [facility]. meds refilled, sent to [facility] at this time.

## 2019-06-26 ENCOUNTER — Ambulatory Visit (INDEPENDENT_AMBULATORY_CARE_PROVIDER_SITE_OTHER): Payer: Medicare Other | Admitting: *Deleted

## 2019-06-26 DIAGNOSIS — J309 Allergic rhinitis, unspecified: Secondary | ICD-10-CM | POA: Diagnosis not present

## 2019-07-06 ENCOUNTER — Other Ambulatory Visit: Payer: Self-pay | Admitting: *Deleted

## 2019-07-06 MED ORDER — AMLODIPINE BESYLATE 5 MG PO TABS: 5.0000 mg | ORAL_TABLET | Freq: Every day | ORAL | 3 refills | Status: DC

## 2019-07-06 NOTE — Progress Notes (Signed)
Refill per message from Dr. Harrington Challenger. Pt had been taking two 2.5 mg tabs daily. Sent new prescription for 5 mg tablets to pt's requested pharmacy.

## 2019-07-21 IMAGING — CT CT CHEST W/O CM
2 of 3 series · 15 of 36 positions shown, 18 images · non-contrast
Comparison: 09/01/2016

CLINICAL DATA: Followup lung nodules.

EXAM:
CT CHEST WITHOUT CONTRAST
TECHNIQUE: Multidetector CT imaging of the chest was performed following the
standard protocol without IV contrast.

[Series 2: thorax · axial · 0.87mm/px · z∈[-360,-76]mm · 12 of 168 slices shown, 15 images]
[im 13/168  mediastinal]
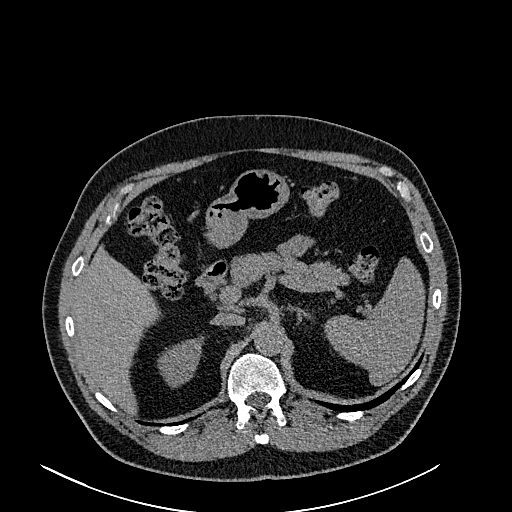
[im 13/168  lung]
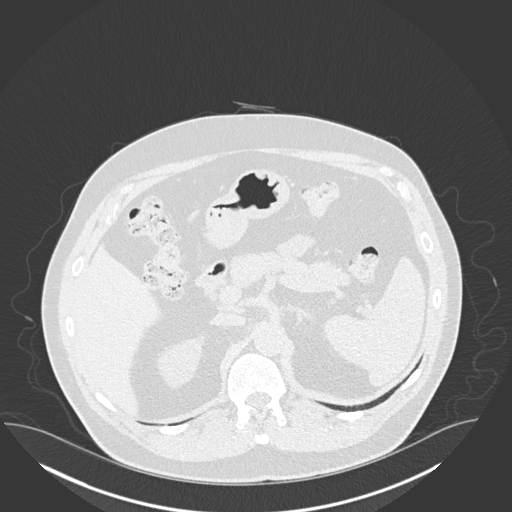
[im 25/168  lung]
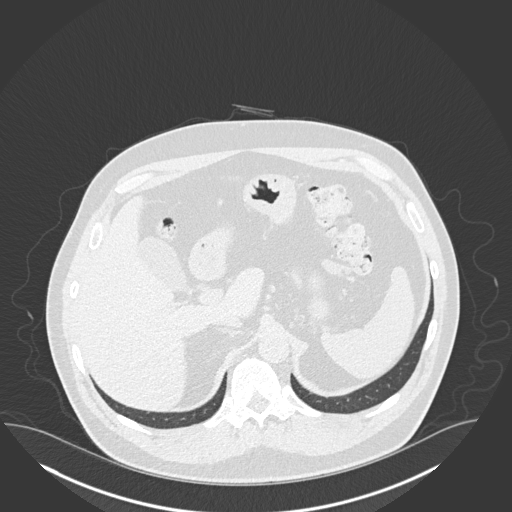
[im 38/168  lung]
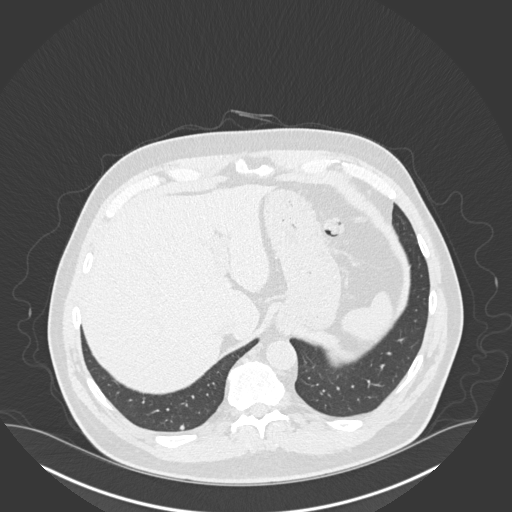
[im 50/168  lung]
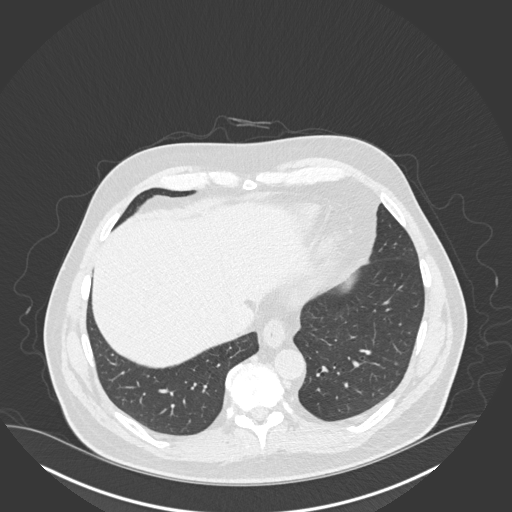
[im 62/168  mediastinal]
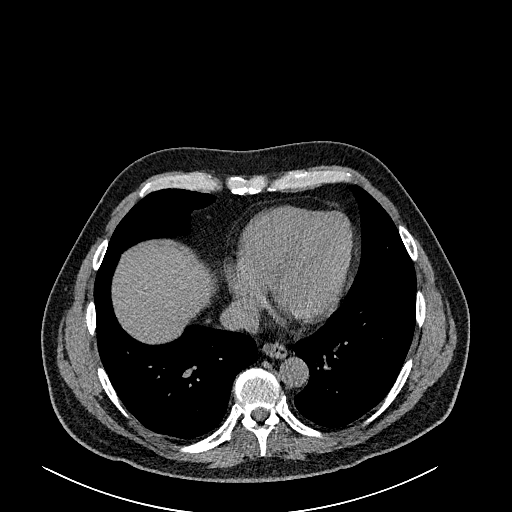
[im 62/168  lung]
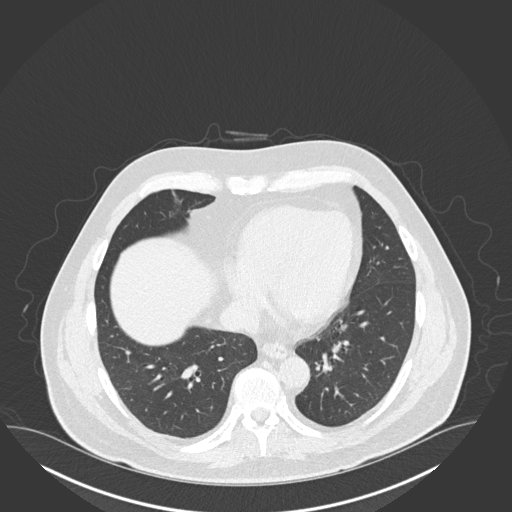
[im 75/168  lung]
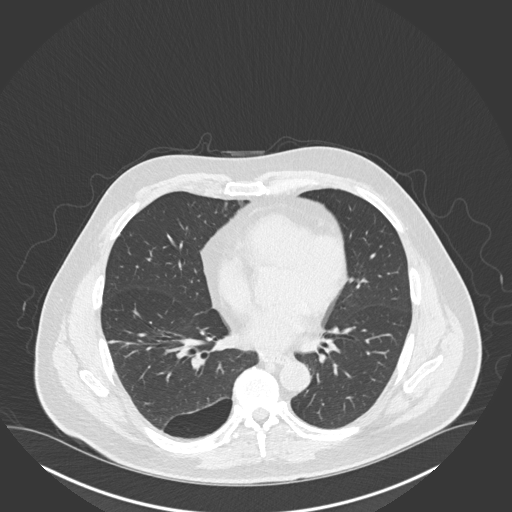
[im 93/168  lung]
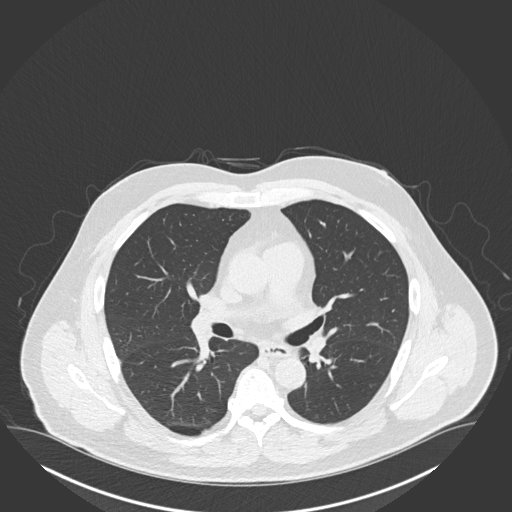
[im 106/168  lung]
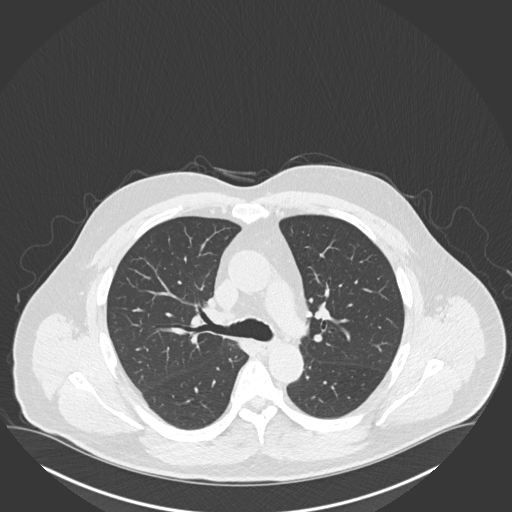
[im 118/168  mediastinal]
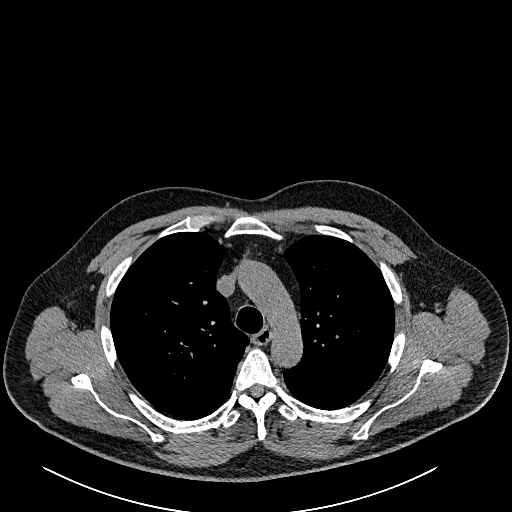
[im 118/168  lung]
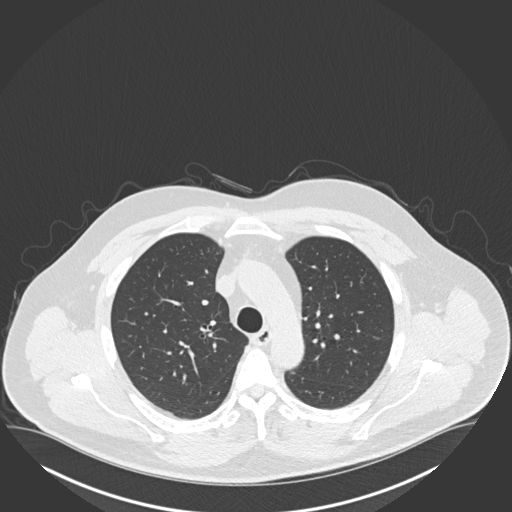
[im 130/168  lung]
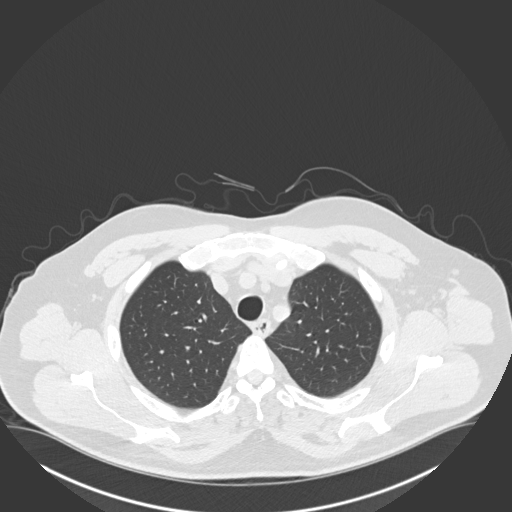
[im 143/168  lung]
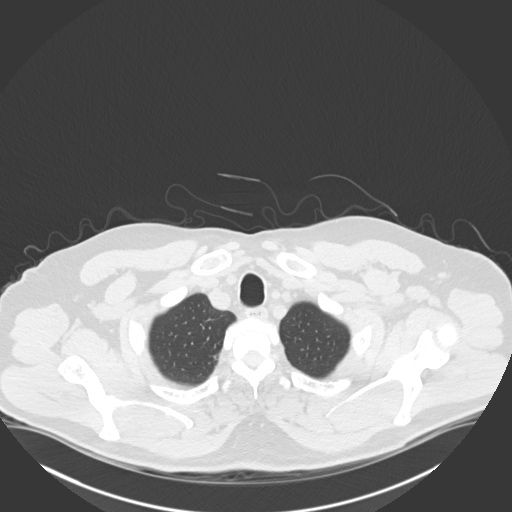
[im 155/168  lung]
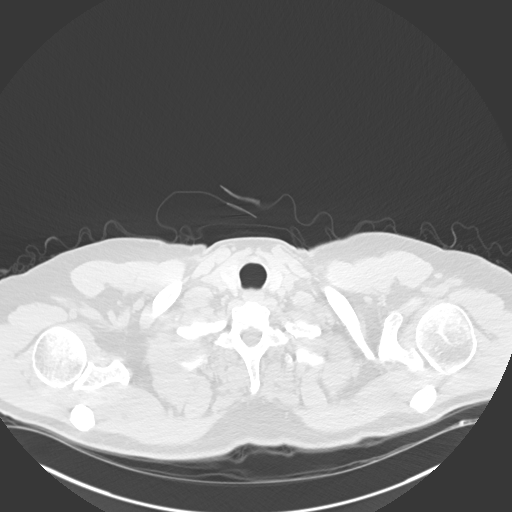

[Series 5: coronal · coronal · 0.69mm/px · 3 of 131 slices shown]
[im 27/131  lung]
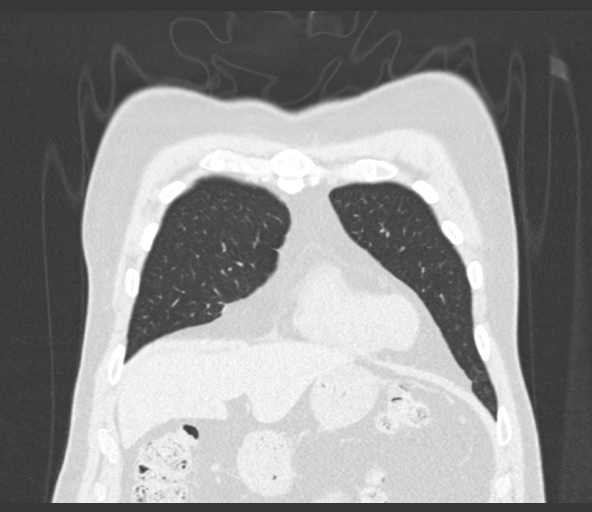
[im 53/131  lung]
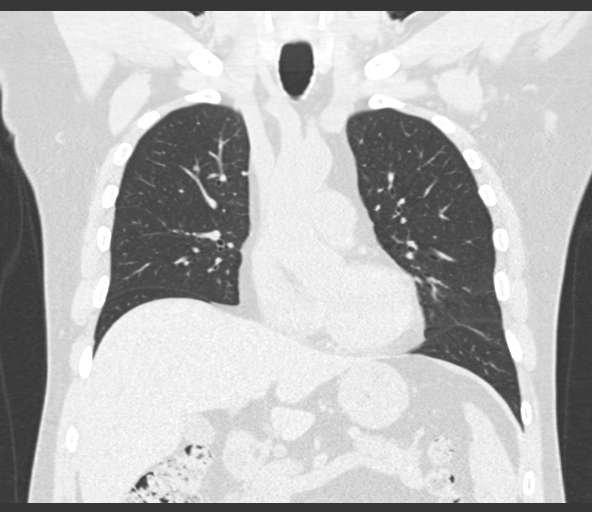
[im 79/131  lung]
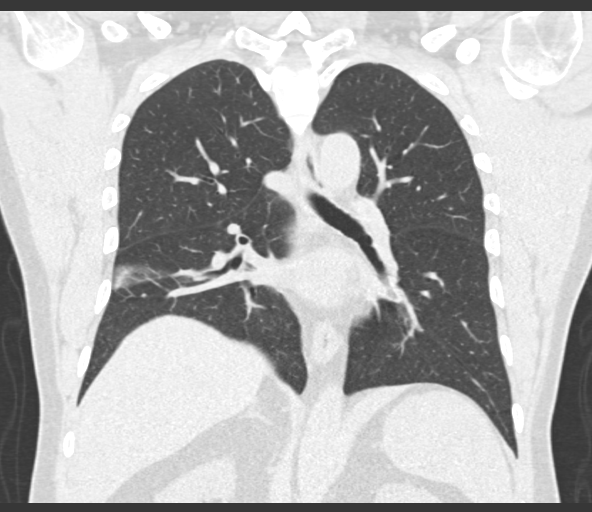

[15 of 36 positions shown; findings below may reference images not displayed]

FINDINGS: Cardiovascular: The heart size appears normal. There is no
pericardial effusion. Calcification in the LAD coronary artery
noted.

Mediastinum/Nodes: No enlarged mediastinal or axillary lymph nodes.
Thyroid gland, trachea, and esophagus demonstrate no significant
findings.

Lungs/Pleura: Nodule in the right lower lobe measures 4 mm and is
unchanged from previous exam, image 87/3. There are 3 nodules within
the left lower lobe which measure up to 5 mm and are unchanged from
previous exam, image 124/3 and image 121/3. Three nodules in the
posterior right lower lobe are identified measuring up to 5 mm,
image 131/3. This area was not imaged on the previous exam. Within
the posterior medial right upper lobe there is a 5 mm nodule, image
41/3. This area was also not included prior exam.

Upper Abdomen: No acute abnormality.

Musculoskeletal: No chest wall mass or suspicious bone lesions
identified.
IMPRESSION: 1. Previously noted pulmonary nodules from 09/11/2016 are stable
compatible with benign process.
2. Several additional nodules within the right lung are identified
which are in areas of the right lung not included on prior exam.
These measure up to 5 mm. No follow-up needed if patient is low-risk
(and has no known or suspected primary neoplasm). Non-contrast chest
CT can be considered in 12 months if patient is high-risk. This
recommendation follows the consensus statement: Guidelines for
Management of Incidental Pulmonary Nodules Detected on CT Images:

## 2019-08-01 ENCOUNTER — Ambulatory Visit (INDEPENDENT_AMBULATORY_CARE_PROVIDER_SITE_OTHER): Payer: Medicare Other

## 2019-08-01 DIAGNOSIS — J309 Allergic rhinitis, unspecified: Secondary | ICD-10-CM

## 2019-08-15 ENCOUNTER — Ambulatory Visit: Payer: Medicare Other | Attending: Internal Medicine

## 2019-08-15 DIAGNOSIS — Z23 Encounter for immunization: Secondary | ICD-10-CM | POA: Insufficient documentation

## 2019-08-15 NOTE — Progress Notes (Signed)
   Covid-19 Vaccination Clinic  Name:  FRANCISCA HARBUCK, Dr.    MRN: [id number] DOB: 27-Mar-1956  08/15/2019  Mr. Helfman was observed post Covid-19 immunization for 15 minutes without incidence. He was provided with Vaccine Information Sheet and instruction to access the V-Safe system.   Mr. Sterkel was instructed to call 911 with any severe reactions post vaccine: Marland Kitchen Difficulty breathing  . Swelling of your face and throat  . A fast heartbeat  . A bad rash all over your body  . Dizziness and weakness    Immunizations Administered    Name Date Dose VIS Date Route   Pfizer COVID-19 Vaccine 08/15/2019  1:58 PM 0.3 mL 07/07/2019 Intramuscular   Manufacturer: [employer], [employer]   Lot: [id number]   NDC: [address]-0

## 2019-08-26 ENCOUNTER — Other Ambulatory Visit: Payer: Self-pay | Admitting: Allergy and Immunology

## 2019-09-04 ENCOUNTER — Ambulatory Visit (INDEPENDENT_AMBULATORY_CARE_PROVIDER_SITE_OTHER): Payer: Medicare Other

## 2019-09-04 DIAGNOSIS — J309 Allergic rhinitis, unspecified: Secondary | ICD-10-CM

## 2019-09-05 ENCOUNTER — Ambulatory Visit (INDEPENDENT_AMBULATORY_CARE_PROVIDER_SITE_OTHER): Payer: Medicare Other | Admitting: Allergy and Immunology

## 2019-09-05 ENCOUNTER — Ambulatory Visit: Payer: BLUE CROSS/BLUE SHIELD

## 2019-09-05 ENCOUNTER — Encounter: Payer: Self-pay | Admitting: Allergy and Immunology

## 2019-09-05 ENCOUNTER — Ambulatory Visit: Payer: Medicare Other | Attending: Internal Medicine

## 2019-09-05 ENCOUNTER — Other Ambulatory Visit: Payer: Self-pay

## 2019-09-05 VITALS — BP 120/80 | HR 80 | Temp 98.0°F | Resp 16 | Ht 73.0 in | Wt 228.4 lb

## 2019-09-05 DIAGNOSIS — J3089 Other allergic rhinitis: Secondary | ICD-10-CM | POA: Diagnosis not present

## 2019-09-05 DIAGNOSIS — J0140 Acute pansinusitis, unspecified: Secondary | ICD-10-CM | POA: Diagnosis not present

## 2019-09-05 DIAGNOSIS — K219 Gastro-esophageal reflux disease without esophagitis: Secondary | ICD-10-CM | POA: Diagnosis not present

## 2019-09-05 DIAGNOSIS — Z23 Encounter for immunization: Secondary | ICD-10-CM | POA: Insufficient documentation

## 2019-09-05 MED ORDER — EPINEPHRINE 0.3 MG/0.3ML IJ SOAJ: 0.3000 mg | Freq: Once | INTRAMUSCULAR | 2 refills | Status: DC

## 2019-09-05 MED ORDER — AZELASTINE-FLUTICASONE 137-50 MCG/ACT NA SUSP: NASAL | 0 refills | Status: DC

## 2019-09-05 MED ORDER — EPINEPHRINE 0.3 MG/0.3ML IJ SOAJ: 0.3000 mg | Freq: Once | INTRAMUSCULAR | 2 refills | Status: AC

## 2019-09-05 MED ORDER — AMOXICILLIN-POT CLAVULANATE 875-125 MG PO TABS: 1.0000 | ORAL_TABLET | Freq: Two times a day (BID) | ORAL | 0 refills | Status: AC

## 2019-09-05 MED ORDER — METRONIDAZOLE 250 MG PO TABS: 250.0000 mg | ORAL_TABLET | Freq: Three times a day (TID) | ORAL | 0 refills | Status: AC

## 2019-09-05 NOTE — Progress Notes (Signed)
   Covid-19 Vaccination Clinic  Name:  Tyler Taylor, Dr.    MRN: [id number] DOB: 08-17-1955  09/05/2019  Tyler Taylor was observed post Covid-19 immunization for 15 minutes without incidence. He was provided with Vaccine Information Sheet and instruction to access the V-Safe system.   Tyler Taylor was instructed to call 911 with any severe reactions post vaccine: Marland Kitchen Difficulty breathing  . Swelling of your face and throat  . A fast heartbeat  . A bad rash all over your body  . Dizziness and weakness    Immunizations Administered    Name Date Dose VIS Date Route   Pfizer COVID-19 Vaccine 09/05/2019  8:25 AM 0.3 mL 07/07/2019 Intramuscular   Manufacturer: [employer], [employer]   Lot: [id number]   NDC: [address]-4

## 2019-09-05 NOTE — Patient Instructions (Addendum)
  1. Continue immunotherapy (EpiPen /  AUVI-Q 0.3)  2. Continue Dymista one spray each nostril twice a day  3. Continue omeprazole 40 mg in a.m. plus famotidine 40 mg in PM  4. Treat infection with:   A. Augmentin 875 - 1 tablet 2 times per day  B. Flagyl 250 - 1 tablet 2 times per day  5. Return to clinic in 1 year or earlier if problemb

## 2019-09-05 NOTE — Progress Notes (Signed)
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 male who [facility] in re-evaluation of the following:  HPI: [name] returns to this clinic in evaluation of allergic rhinitis and LPR.  His last visit to this clinic was 27 September 2018.  Overall he has really done well with his airway while consistently using immunotherapy currently at every 4-week administration and Dymista on a relatively frequent basis.  Unfortunately, about 1 month ago he developed a sinus infection with head pressure and green nasal discharge whenever he washes out his nose.  This does not appear to be clearing spontaneously.  Prior to this event he did not require systemic steroid or antibiotic for any type of airway issue.  He is requesting an antibiotic to treat this issue and in the past he has been very successful in utilizing a combination of a beta-lactam and metronidazole.  Reflux has not been a problem in his throat has been doing well with therapy which includes a proton pump inhibitor and an H2 receptor blocker.  He did obtain the flu vaccine in both parts of a Covid vaccine.  Allergies as of 09/05/2019   No Known Allergies     Medication List      amLODipine 5 MG tablet Commonly known as: NORVASC Take 1 tablet (5 mg total) by mouth daily.   Azelastine-Fluticasone 137-50 MCG/ACT Susp USE 1-2 SPRAYS IN EACH NOSTRIL TWICE DAILY   chloroquine 500 MG tablet Commonly known as: ARALEN Take 1 tablet weekly, begin 1 week prior to travel and complete 4 weeks after travel.   diphenhydrAMINE 50 MG tablet Commonly known as: BENADRYL Take 50 mg by mouth at bedtime.   docusate sodium 100 MG capsule Commonly known as: Colace Take 1 capsule  (100 mg total) by mouth 2 (two) times daily.   EPINEPHrine 0.3 mg/0.3 mL Soaj injection Commonly known as: EpiPen 2-Pak Inject 0.3 mLs (0.3 mg total) into the muscle as needed for anaphylaxis.   ezetimibe 10 MG tablet Commonly known as: ZETIA Take 1 tablet (10 mg total) by mouth daily.   famotidine 20 MG tablet Commonly known as: PEPCID Take 20 mg by mouth 2 (two) times daily.   loratadine 10 MG tablet Commonly known as: CLARITIN Take 10 mg by mouth daily.   multivitamin with minerals Tabs tablet Take 1 tablet by mouth daily.   omeprazole 40 MG capsule Commonly known as: PRILOSEC Take 40 mg by mouth daily.   pravastatin 80 MG tablet Commonly known as: PRAVACHOL Take 1 tablet (80 mg total) by mouth every evening.   pseudoephedrine-guaifenesin 60-600 MG 12 hr tablet Commonly known as: MUCINEX D Take 0.5 tablets by mouth daily.       Past Medical History:  Diagnosis Date  . Complication of anesthesia   . Family history of adverse reaction to anesthesia    severe ponv mother  . GERD (gastroesophageal reflux disease)   . Hemorrhoids   . Hiatal hernia   . Hyperlipemia, mixed   . Nocturia   . Pneumonia April 2013   walking  . PONV (postoperative nausea and vomiting)    likes scopolamine patch and propofol helped with last surgery  . Seasonal allergic rhinitis   . SOB (shortness of  breath) 15 yrs ago  . Tension pneumothorax 2002   from diving    Past Surgical History:  Procedure Laterality Date  . APPENDECTOMY    . GANGLION CYST EXCISION     left wrist  . HEMORRHOID SURGERY N/A 07/02/2017   Procedure: SINGLE COLUMN HEMORRHOIDECTOMY;  Surgeon: Romie Levee, MD;  Location: [facility];  Service: General;  Laterality: N/A;  . SHOULDER SURGERY  09/2015   rotator cuff reattach superspinatour and bone spur removal  . TONSILLECTOMY    . TRANSANAL HEMORRHOIDAL DEARTERIALIZATION  07/22/2012   Procedure: TRANSANAL HEMORRHOIDAL DEARTERIALIZATION;   Surgeon: Romie Levee, MD;  Location: [facility];  Service: General;  Laterality: N/A;    Review of systems negative except as noted in HPI / PMHx or noted below:  Review of Systems  Constitutional: Negative.   HENT: Negative.   Eyes: Negative.   Respiratory: Negative.   Cardiovascular: Negative.   Gastrointestinal: Negative.   Genitourinary: Negative.   Musculoskeletal: Negative.   Skin: Negative.   Neurological: Negative.   Endo/Heme/Allergies: Negative.   Psychiatric/Behavioral: Negative.      Objective:   Vitals:   09/05/19 1549  BP: 120/80  Pulse: 80  Resp: 16  Temp: 98 F (36.7 C)  SpO2: 97%   Height: 6\' 1"  (185.4 cm)  Weight: 228 lb 6.4 oz (103.6 kg)   Physical Exam Constitutional:      Appearance: He is not diaphoretic.  HENT:     Head: Normocephalic.     Right Ear: Tympanic membrane, ear canal and external ear normal.     Left Ear: Tympanic membrane, ear canal and external ear normal.     Nose: Nose normal. No mucosal edema or rhinorrhea.     Mouth/Throat:     Pharynx: Uvula midline. No oropharyngeal exudate.  Eyes:     Conjunctiva/sclera: Conjunctivae normal.  Neck:     Thyroid: No thyromegaly.     Trachea: Trachea normal. No tracheal tenderness or tracheal deviation.  Cardiovascular:     Rate and Rhythm: Normal rate and regular rhythm.     Heart sounds: Normal heart sounds, S1 normal and S2 normal. No murmur.  Pulmonary:     Effort: No respiratory distress.     Breath sounds: Normal breath sounds. No stridor. No wheezing or rales.  Lymphadenopathy:     Head:     Right side of head: No tonsillar adenopathy.     Left side of head: No tonsillar adenopathy.     Cervical: No cervical adenopathy.  Skin:    Findings: No erythema or rash.     Nails: There is no clubbing.  Neurological:     Mental Status: He is alert.     Diagnostics: none   Assessment and Plan:   1. Perennial allergic rhinitis   2. Acute non-recurrent pansinusitis   3. LPRD  (laryngopharyngeal reflux disease)     1. Continue immunotherapy (EpiPen /  AUVI-Q 0.3)  2. Continue Dymista one spray each nostril twice a day  3. Continue omeprazole 40 mg in a.m. plus famotidine 40 mg in PM  4. Treat infection with:   A. Augmentin 875 - 1 tablet 2 times per day  B. Flagyl 250 - 1 tablet 2 times per day  5. Return to clinic in 1 year or earlier if problem  I will assume that Jabin will do quite well with his therapy noted above which includes treatment directed against his atopic respiratory disease and what appears to be  a persistent sinus infection.  Assuming he does well on this plan I will see him back in this clinic in 1 year or earlier if there is a problem.  Allena Katz, MD Allergy / Immunology [place]

## 2019-09-06 ENCOUNTER — Encounter: Payer: Self-pay | Admitting: Allergy and Immunology

## 2019-09-06 ENCOUNTER — Telehealth: Payer: Self-pay | Admitting: Allergy and Immunology

## 2019-09-06 MED ORDER — AZELASTINE-FLUTICASONE 137-50 MCG/ACT NA SUSP: NASAL | 11 refills | Status: DC

## 2019-09-06 NOTE — Telephone Encounter (Signed)
Prescription with 1 year of refills sent as Dr. Lucie Leather did tell the patient to follow up in 1 year. I spoke with Tyler Taylor and let him know.

## 2019-09-06 NOTE — Telephone Encounter (Signed)
Patient called and said that only 1 dymista was called in and no refills instead of a tear of refills

## 2019-10-04 ENCOUNTER — Other Ambulatory Visit: Payer: Self-pay | Admitting: Allergy and Immunology

## 2019-10-11 ENCOUNTER — Ambulatory Visit (INDEPENDENT_AMBULATORY_CARE_PROVIDER_SITE_OTHER): Payer: Medicare Other

## 2019-10-11 DIAGNOSIS — J309 Allergic rhinitis, unspecified: Secondary | ICD-10-CM

## 2019-10-21 ENCOUNTER — Telehealth: Payer: Self-pay | Admitting: Internal Medicine

## 2019-10-21 DIAGNOSIS — I10 Essential (primary) hypertension: Secondary | ICD-10-CM

## 2019-10-21 NOTE — Telephone Encounter (Signed)
Patient texted  BP is still not optimal Would recomm losartan 25 mg daily I called in to [facility] in [place]   1 month/6 refills  Pt will need BMET in 2 wks

## 2019-10-24 MED ORDER — LOSARTAN POTASSIUM 25 MG PO TABS: 25.0000 mg | ORAL_TABLET | Freq: Every day | ORAL | 6 refills | Status: DC

## 2019-10-24 NOTE — Telephone Encounter (Addendum)
Losartan added to med list. bmet ordered for in 2 weeks. myChart message to patient to arrange lab appointment.

## 2019-10-24 NOTE — Addendum Note (Signed)
Addended by: Lendon Ka on: 10/24/2019 09:21 AM   Modules accepted: Orders

## 2019-11-13 ENCOUNTER — Ambulatory Visit (INDEPENDENT_AMBULATORY_CARE_PROVIDER_SITE_OTHER): Payer: Medicare Other

## 2019-11-13 DIAGNOSIS — J309 Allergic rhinitis, unspecified: Secondary | ICD-10-CM

## 2019-11-16 DIAGNOSIS — J301 Allergic rhinitis due to pollen: Secondary | ICD-10-CM

## 2019-11-16 NOTE — Progress Notes (Signed)
VIALS EXP 11-15-20 

## 2019-11-20 DIAGNOSIS — J3089 Other allergic rhinitis: Secondary | ICD-10-CM | POA: Diagnosis not present

## 2019-11-21 DIAGNOSIS — J3081 Allergic rhinitis due to animal (cat) (dog) hair and dander: Secondary | ICD-10-CM | POA: Diagnosis not present

## 2019-12-18 ENCOUNTER — Ambulatory Visit (INDEPENDENT_AMBULATORY_CARE_PROVIDER_SITE_OTHER): Payer: Medicare Other

## 2019-12-18 DIAGNOSIS — J309 Allergic rhinitis, unspecified: Secondary | ICD-10-CM | POA: Diagnosis not present

## 2020-01-19 ENCOUNTER — Ambulatory Visit (INDEPENDENT_AMBULATORY_CARE_PROVIDER_SITE_OTHER): Payer: Medicare Other

## 2020-01-19 DIAGNOSIS — J309 Allergic rhinitis, unspecified: Secondary | ICD-10-CM

## 2020-02-02 ENCOUNTER — Encounter: Payer: Self-pay | Admitting: Internal Medicine

## 2020-02-02 ENCOUNTER — Other Ambulatory Visit: Payer: Self-pay

## 2020-02-02 ENCOUNTER — Ambulatory Visit (INDEPENDENT_AMBULATORY_CARE_PROVIDER_SITE_OTHER): Payer: Medicare Other | Admitting: Internal Medicine

## 2020-02-02 VITALS — BP 146/82 | HR 77 | Ht 73.0 in | Wt 224.8 lb

## 2020-02-02 DIAGNOSIS — R3911 Hesitancy of micturition: Secondary | ICD-10-CM

## 2020-02-02 DIAGNOSIS — R5383 Other fatigue: Secondary | ICD-10-CM

## 2020-02-02 DIAGNOSIS — I10 Essential (primary) hypertension: Secondary | ICD-10-CM

## 2020-02-02 DIAGNOSIS — E785 Hyperlipidemia, unspecified: Secondary | ICD-10-CM

## 2020-02-02 NOTE — Patient Instructions (Signed)
Medication Instructions:  Your physician recommends that you continue on your current medications as directed. Please refer to the Current Medication list given to you today.  *If you need a refill on your cardiac medications before your next appointment, please call your pharmacy*   Lab Work: TODAY - PSA, CBC, BMET, NMR w/Lipid, Liver If you have labs (blood work) drawn today and your tests are completely normal, you will receive your results only by: Marland Kitchen MyChart Message (if you have MyChart) OR . A paper copy in the mail If you have any lab test that is abnormal or we need to change your treatment, we will call you to review the results.   Testing/Procedures: None Ordered   Follow-Up: At [facility], you and your health needs are our priority.  As part of our continuing mission to provide you with exceptional heart care, we have created designated Provider Care Teams.  These Care Teams include your primary Cardiologist (physician) and Advanced Practice Providers (APPs -  Physician Assistants and Nurse Practitioners) who all work together to provide you with the care you need, when you need it.   Your next appointment:   1 year(s)  The format for your next appointment:   In Person  Provider:   You may see Dietrich Pates, MD or one of the following Advanced Practice Providers on your designated Care Team:    Tereso Newcomer, PA-C  [place], [place]

## 2020-02-02 NOTE — Progress Notes (Addendum)
Cardiology Office Note   Date:  02/02/2020   ID:  Tyler Taylor, Dr., DOB 1956/02/12, MRN [id number]  PCP:  [facility] [place]  Cardiologist:   Dietrich Pates, MD    Pt presents for f/u of hyperlipidemia and blood pressure    History of Present Illness: Tyler Taylor, Dr. is a 64 y.o. male with a history of  Hyperlipidemia   I saw him for the first time last spring   He had been  foloowed by Geanie Kenning at [facility] in past  Has been there for years  Had GXT in remote past (1995) for CP  Negative  Placed on statin in 1990s   When I saw him initially I recomm he get a calcium score CT   His score was 17   CT also showed some small pulmonary nondules   F/U CT unchanged (though a new part of lung imaged on last screen  Small    Initially I recomm tighter contol of lipids and  Increased pravastatin to 80  Repeat lipomed showed very small drop in particles  With this I recomm he switch to Crestor  Unfortunately he developed muscle aches on the Cretor   Went back to 80 mg daily pravstatin  I saw him in clinic about 1 year ago   In the interval he has done well  He denies CP   Breathing is OK   He is working 1 wk per month. Log of BPs shows 130s/140s/        Current Meds  Medication Sig   amLODipine (NORVASC) 5 MG tablet Take 1 tablet (5 mg total) by mouth daily.   Azelastine-Fluticasone 137-50 MCG/ACT SUSP USE 1-2 SPRAYS IN EACH NOSTRIL TWICE DAILY   chloroquine (ARALEN) 500 MG tablet Take 1 tablet weekly, begin 1 week prior to travel and complete 4 weeks after travel.   diphenhydrAMINE (BENADRYL) 50 MG tablet Take 50 mg by mouth at bedtime.   docusate sodium (COLACE) 100 MG capsule Take 1 capsule (100 mg total) by mouth 2 (two) times daily. (Patient taking differently: Take 100 mg by mouth daily. )   EPINEPHrine (EPIPEN 2-PAK) 0.3 mg/0.3 mL IJ SOAJ injection Inject 0.3 mLs (0.3 mg total) into the muscle as needed for anaphylaxis.   ezetimibe (ZETIA) 10 MG tablet  Take 1 tablet (10 mg total) by mouth daily.   famotidine (PEPCID) 20 MG tablet Take 20 mg by mouth 2 (two) times daily.   loratadine (CLARITIN) 10 MG tablet Take 10 mg by mouth daily.   losartan (COZAAR) 25 MG tablet Take 1 tablet (25 mg total) by mouth daily.   Multiple Vitamin (MULTIVITAMIN WITH MINERALS) TABS Take 1 tablet by mouth daily.   omeprazole (PRILOSEC) 40 MG capsule Take 40 mg by mouth daily.   pravastatin (PRAVACHOL) 80 MG tablet Take 1 tablet (80 mg total) by mouth every evening.     Allergies:   Patient has no known allergies.   Past Medical History:  Diagnosis Date   Complication of anesthesia    Family history of adverse reaction to anesthesia    severe ponv mother   GERD (gastroesophageal reflux disease)    Hemorrhoids    Hiatal hernia    Hyperlipemia, mixed    Nocturia    Pneumonia April 2013   walking   PONV (postoperative nausea and vomiting)    likes scopolamine patch and propofol helped with last surgery   Seasonal allergic rhinitis  SOB (shortness of breath) 15 yrs ago   Tension pneumothorax 2002   from diving    Past Surgical History:  Procedure Laterality Date   APPENDECTOMY     GANGLION CYST EXCISION     left wrist   HEMORRHOID SURGERY N/A 07/02/2017   Procedure: SINGLE COLUMN HEMORRHOIDECTOMY;  Surgeon: Romie Levee, MD;  Location: [facility];  Service: General;  Laterality: N/A;   SHOULDER SURGERY  09/2015   rotator cuff reattach superspinatour and bone spur removal   TONSILLECTOMY     TRANSANAL HEMORRHOIDAL DEARTERIALIZATION  07/22/2012   Procedure: TRANSANAL HEMORRHOIDAL DEARTERIALIZATION;  Surgeon: Romie Levee, MD;  Location: [facility];  Service: General;  Laterality: N/A;     Social History:  The patient  reports that he has never smoked. He has never used smokeless tobacco. He reports that he does not drink alcohol and does not use drugs.   Family History:  The patient's family history  includes Melanoma in his mother.    ROS:  Please see the history of present illness. All other systems are reviewed and  Negative to the above problem except as noted.    PHYSICAL EXAM: VS:  BP (!) 146/82    Pulse 77    Ht 6\' 1"  (1.854 m)    Wt 224 lb 12.8 oz (102 kg)    SpO2 97%    BMI 29.66 kg/m     GEN: Well nourished, well developed, in no acute distress  HEENT: normal  Neck: JVP is normal  NO carotid bruits Cardiac: RRR;  No LE edema  Respiratory:  clear to auscultation bilaterally, normal work of breathing GI: soft, nontender, nondistended, + BS  No hepatomegaly  MS: no deformity Moving all extremities   Skin: warm and dry, no rash Neuro:  Strength and sensation are intact Psych: euthymic mood, full affect   EKG:  EKG is ordered today.    SR 77 bpm    Lipid Panel    Component Value Date/Time   CHOL 165 12/15/2016 0833   CHOL 160 09/11/2016 1335   TRIG 130 12/15/2016 0833   HDL 46 12/15/2016 0833   CHOLHDL 3.9 09/11/2016 1335   LDLCALC 94 09/11/2016 1335      Wt Readings from Last 3 Encounters:  02/02/20 224 lb 12.8 oz (102 kg)  09/05/19 228 lb 6.4 oz (103.6 kg)  12/26/18 228 lb 9.6 oz (103.7 kg)      ASSESSMENT AND PLAN:  1  Dyslipidemia  WIll get lipomed today   Discussed diet, exercise   2.  HTN   REview of BP, still not optimal   Will check labs today   Consider increase   I also recomm wt loss, dietary changes when may reach goal without change in meds     3  Coronary calcifications   Ca Score is 17   Minimal plaquing  Keep on statin   No symptoms of angina    4  Pulmonary nodules  Stable on previous CT with no f/u recomm  Check CBC, CMET, TSH, PSA and lipomed  F/U in 1 year  Encouraged him to stay active   Current medicines are reviewed at length with the patient today.  The patient does not have concerns regarding medicines.  Signed, 02/25/19, MD  02/02/2020 8:39 AM    [facility] [address] [place], [place], [place]   [place] Phone: [phone]; Fax: [phone]

## 2020-02-03 LAB — CBC WITH DIFFERENTIAL/PLATELET
Basophils Absolute: 0 10*3/uL (ref 0.0–0.2)
Basos: 1 %
EOS (ABSOLUTE): 0.3 10*3/uL (ref 0.0–0.4)
Eos: 3 %
Hematocrit: 43.8 % (ref 37.5–51.0)
Hemoglobin: 15.2 g/dL (ref 13.0–17.7)
Immature Grans (Abs): 0.1 10*3/uL (ref 0.0–0.1)
Immature Granulocytes: 1 %
Lymphocytes Absolute: 2.1 10*3/uL (ref 0.7–3.1)
Lymphs: 24 %
MCH: 31 pg (ref 26.6–33.0)
MCHC: 34.7 g/dL (ref 31.5–35.7)
MCV: 89 fL (ref 79–97)
Monocytes Absolute: 0.8 10*3/uL (ref 0.1–0.9)
Monocytes: 9 %
Neutrophils Absolute: 5.5 10*3/uL (ref 1.4–7.0)
Neutrophils: 62 %
Platelets: 202 10*3/uL (ref 150–450)
RBC: 4.91 x10E6/uL (ref 4.14–5.80)
RDW: 12.7 % (ref 11.6–15.4)
WBC: 8.6 10*3/uL (ref 3.4–10.8)

## 2020-02-03 LAB — TSH: TSH: 2.46 u[IU]/mL (ref 0.450–4.500)

## 2020-02-03 LAB — NMR LIPOPROF + GRAPH
Cholesterol, Total: 151 mg/dL (ref 100–199)
HDL Particle Number: 35.8 umol/L (ref >=30.5)
HDL-C: 48 mg/dL (ref >39)
LDL Particle Number: 923 nmol/L (ref <1000)
LDL Size: 20.7 nm (ref >20.5)
LDL-C (NIH Calc): 84 mg/dL (ref 0–99)
LP-IR Score: 60 — ABNORMAL HIGH (ref <=45)
Small LDL Particle Number: 331 nmol/L (ref <=527)
Triglycerides: 103 mg/dL (ref 0–149)

## 2020-02-03 LAB — BASIC METABOLIC PANEL
BUN/Creatinine Ratio: 14 (ref 10–24)
BUN: 18 mg/dL (ref 8–27)
CO2: 23 mmol/L (ref 20–29)
Calcium: 9.8 mg/dL (ref 8.6–10.2)
Chloride: 101 mmol/L (ref 96–106)
Creatinine, Ser: 1.31 mg/dL — ABNORMAL HIGH (ref 0.76–1.27)
GFR calc Af Amer: 66 mL/min/{1.73_m2} (ref >59)
GFR calc non Af Amer: 57 mL/min/{1.73_m2} — ABNORMAL LOW (ref >59)
Glucose: 110 mg/dL — ABNORMAL HIGH (ref 65–99)
Potassium: 4.3 mmol/L (ref 3.5–5.2)
Sodium: 138 mmol/L (ref 134–144)

## 2020-02-03 LAB — HEPATIC FUNCTION PANEL
ALT: 31 IU/L (ref 0–44)
AST: 24 IU/L (ref 0–40)
Albumin: 4.8 g/dL (ref 3.8–4.8)
Alkaline Phosphatase: 75 IU/L (ref 48–121)
Bilirubin Total: 0.4 mg/dL (ref 0.0–1.2)
Bilirubin, Direct: 0.14 mg/dL (ref 0.00–0.40)
Total Protein: 6.9 g/dL (ref 6.0–8.5)

## 2020-02-03 LAB — PSA: Prostate Specific Ag, Serum: 1.2 ng/mL (ref 0.0–4.0)

## 2020-02-27 ENCOUNTER — Telehealth: Payer: Self-pay | Admitting: *Deleted

## 2020-02-27 DIAGNOSIS — E785 Hyperlipidemia, unspecified: Secondary | ICD-10-CM

## 2020-02-27 DIAGNOSIS — R7309 Other abnormal glucose: Secondary | ICD-10-CM

## 2020-02-27 DIAGNOSIS — I10 Essential (primary) hypertension: Secondary | ICD-10-CM

## 2020-02-27 DIAGNOSIS — R3911 Hesitancy of micturition: Secondary | ICD-10-CM

## 2020-02-27 NOTE — Telephone Encounter (Signed)
Lab orders placed. MyChart message to patient to let him know he can call to scheduled lab appointment when he is back in town.

## 2020-02-27 NOTE — Telephone Encounter (Signed)
-----   Message from Pricilla Riffle, MD sent at 02/05/2020  3:08 PM EDT ----- Reviewed labs with pt He is going out of town When comes back:   Check Hgb A1c,   Repeat BMET.   Check urinalysis

## 2020-02-28 ENCOUNTER — Ambulatory Visit (INDEPENDENT_AMBULATORY_CARE_PROVIDER_SITE_OTHER): Payer: Medicare Other | Admitting: *Deleted

## 2020-02-28 DIAGNOSIS — J309 Allergic rhinitis, unspecified: Secondary | ICD-10-CM | POA: Diagnosis not present

## 2020-03-07 ENCOUNTER — Ambulatory Visit (INDEPENDENT_AMBULATORY_CARE_PROVIDER_SITE_OTHER): Payer: Medicare Other

## 2020-03-07 DIAGNOSIS — J309 Allergic rhinitis, unspecified: Secondary | ICD-10-CM | POA: Diagnosis not present

## 2020-04-03 ENCOUNTER — Other Ambulatory Visit: Payer: Self-pay | Admitting: Internal Medicine

## 2020-04-03 ENCOUNTER — Ambulatory Visit (INDEPENDENT_AMBULATORY_CARE_PROVIDER_SITE_OTHER): Payer: Medicare Other

## 2020-04-03 DIAGNOSIS — J309 Allergic rhinitis, unspecified: Secondary | ICD-10-CM

## 2020-04-29 ENCOUNTER — Other Ambulatory Visit: Payer: Self-pay | Admitting: Internal Medicine

## 2020-05-07 ENCOUNTER — Ambulatory Visit (INDEPENDENT_AMBULATORY_CARE_PROVIDER_SITE_OTHER): Payer: Medicare Other

## 2020-05-07 DIAGNOSIS — J309 Allergic rhinitis, unspecified: Secondary | ICD-10-CM

## 2020-05-30 ENCOUNTER — Telehealth: Payer: Self-pay | Admitting: Internal Medicine

## 2020-05-30 DIAGNOSIS — R7309 Other abnormal glucose: Secondary | ICD-10-CM

## 2020-05-30 DIAGNOSIS — E785 Hyperlipidemia, unspecified: Secondary | ICD-10-CM

## 2020-05-30 MED ORDER — PRAVASTATIN SODIUM 80 MG PO TABS: 80.0000 mg | ORAL_TABLET | Freq: Every evening | ORAL | 2 refills | Status: DC

## 2020-05-30 NOTE — Telephone Encounter (Signed)
Discontinued amlodipine and re ordered pravastatin to [facility]. Placed orders for labs for Jan.

## 2020-05-30 NOTE — Telephone Encounter (Signed)
Spoke to patient  He lost 10 lbs    BP is better Feeling good  recomm Cut out amlodipine   Follow BP Get lipomed and Hgb A1C in January 2022  He would like to get pravastatin 80 at [facility] on [address]   90 day    His mail order no longer carries   Needs new Rx  He will follow BP at home

## 2020-05-30 NOTE — Addendum Note (Signed)
Addended by: Lendon Ka on: 05/30/2020 03:35 PM   Modules accepted: Orders

## 2020-06-07 ENCOUNTER — Ambulatory Visit (INDEPENDENT_AMBULATORY_CARE_PROVIDER_SITE_OTHER): Payer: Medicare Other

## 2020-06-07 DIAGNOSIS — J309 Allergic rhinitis, unspecified: Secondary | ICD-10-CM

## 2020-07-02 ENCOUNTER — Other Ambulatory Visit: Payer: Self-pay | Admitting: Internal Medicine

## 2020-07-12 ENCOUNTER — Ambulatory Visit (INDEPENDENT_AMBULATORY_CARE_PROVIDER_SITE_OTHER): Payer: Medicare Other

## 2020-07-12 DIAGNOSIS — J309 Allergic rhinitis, unspecified: Secondary | ICD-10-CM | POA: Diagnosis not present

## 2020-08-14 ENCOUNTER — Ambulatory Visit (INDEPENDENT_AMBULATORY_CARE_PROVIDER_SITE_OTHER): Payer: Medicare Other

## 2020-08-14 DIAGNOSIS — J309 Allergic rhinitis, unspecified: Secondary | ICD-10-CM | POA: Diagnosis not present

## 2020-09-08 ENCOUNTER — Other Ambulatory Visit: Payer: Self-pay | Admitting: Allergy and Immunology

## 2020-09-17 ENCOUNTER — Ambulatory Visit (INDEPENDENT_AMBULATORY_CARE_PROVIDER_SITE_OTHER): Payer: Medicare Other | Admitting: *Deleted

## 2020-09-17 DIAGNOSIS — J309 Allergic rhinitis, unspecified: Secondary | ICD-10-CM | POA: Diagnosis not present

## 2020-09-24 NOTE — Progress Notes (Signed)
VIALS EXP 09-24-21 

## 2020-10-14 ENCOUNTER — Ambulatory Visit (INDEPENDENT_AMBULATORY_CARE_PROVIDER_SITE_OTHER): Payer: Medicare Other | Admitting: *Deleted

## 2020-10-14 DIAGNOSIS — J309 Allergic rhinitis, unspecified: Secondary | ICD-10-CM

## 2020-11-12 ENCOUNTER — Ambulatory Visit (INDEPENDENT_AMBULATORY_CARE_PROVIDER_SITE_OTHER): Payer: Medicare Other | Admitting: *Deleted

## 2020-11-12 DIAGNOSIS — J309 Allergic rhinitis, unspecified: Secondary | ICD-10-CM

## 2020-11-25 ENCOUNTER — Other Ambulatory Visit: Payer: Self-pay | Admitting: Internal Medicine

## 2020-12-10 ENCOUNTER — Ambulatory Visit (INDEPENDENT_AMBULATORY_CARE_PROVIDER_SITE_OTHER): Payer: Medicare Other | Admitting: *Deleted

## 2020-12-10 DIAGNOSIS — J309 Allergic rhinitis, unspecified: Secondary | ICD-10-CM

## 2020-12-20 ENCOUNTER — Ambulatory Visit (INDEPENDENT_AMBULATORY_CARE_PROVIDER_SITE_OTHER): Payer: Medicare Other

## 2020-12-20 DIAGNOSIS — J309 Allergic rhinitis, unspecified: Secondary | ICD-10-CM

## 2020-12-28 ENCOUNTER — Other Ambulatory Visit: Payer: Self-pay | Admitting: Interventional Cardiology

## 2021-01-06 ENCOUNTER — Other Ambulatory Visit: Payer: Self-pay

## 2021-01-06 MED ORDER — PRAVASTATIN SODIUM 80 MG PO TABS
80.0000 mg | ORAL_TABLET | Freq: Every evening | ORAL | 0 refills | Status: DC
Start: 2021-01-06 — End: 2021-04-02

## 2021-01-21 ENCOUNTER — Ambulatory Visit (INDEPENDENT_AMBULATORY_CARE_PROVIDER_SITE_OTHER): Payer: Medicare Other | Admitting: *Deleted

## 2021-01-21 DIAGNOSIS — J309 Allergic rhinitis, unspecified: Secondary | ICD-10-CM

## 2021-02-25 ENCOUNTER — Ambulatory Visit (INDEPENDENT_AMBULATORY_CARE_PROVIDER_SITE_OTHER): Payer: Medicare Other

## 2021-02-25 DIAGNOSIS — J309 Allergic rhinitis, unspecified: Secondary | ICD-10-CM

## 2021-03-21 ENCOUNTER — Other Ambulatory Visit: Payer: Self-pay | Admitting: Allergy and Immunology

## 2021-03-23 ENCOUNTER — Other Ambulatory Visit: Payer: Self-pay | Admitting: Internal Medicine

## 2021-03-31 ENCOUNTER — Other Ambulatory Visit: Payer: Self-pay | Admitting: Internal Medicine

## 2021-04-01 ENCOUNTER — Ambulatory Visit (INDEPENDENT_AMBULATORY_CARE_PROVIDER_SITE_OTHER): Payer: Medicare Other

## 2021-04-01 DIAGNOSIS — J309 Allergic rhinitis, unspecified: Secondary | ICD-10-CM

## 2021-04-22 NOTE — Patient Instructions (Addendum)
Perennial allergic rhinitis Continue allergy injections per protocol and have access to epinephrine autoinjector device. Continue Dymista 1 spray each nostril twice a day as needed for runny/stuffy nose May use saline nasal rinse or saline nasal spray as needed for nasal symptoms.  Use this prior to any medicated nasal sprays.  Laryngopharyngeal reflux disease Continue dietary and lifestyle modifications Continue omeprazole 40 mg  once a day Continue famotidine 40 mg once a day  Please let us know if this treatment plan is not working well for you. Schedule a follow-up appointment in 12 months or sooner if needed

## 2021-04-23 ENCOUNTER — Encounter: Payer: Self-pay | Admitting: Family

## 2021-04-23 ENCOUNTER — Other Ambulatory Visit: Payer: Self-pay

## 2021-04-23 ENCOUNTER — Ambulatory Visit (INDEPENDENT_AMBULATORY_CARE_PROVIDER_SITE_OTHER): Payer: Medicare Other | Admitting: Family

## 2021-04-23 VITALS — BP 124/72 | HR 68 | Temp 98.2°F | Resp 16 | Ht 73.0 in | Wt 213.0 lb

## 2021-04-23 DIAGNOSIS — J0140 Acute pansinusitis, unspecified: Secondary | ICD-10-CM

## 2021-04-23 DIAGNOSIS — K219 Gastro-esophageal reflux disease without esophagitis: Secondary | ICD-10-CM | POA: Diagnosis not present

## 2021-04-23 MED ORDER — AZELASTINE-FLUTICASONE 137-50 MCG/ACT NA SUSP: NASAL | 5 refills | Status: DC

## 2021-04-23 MED ORDER — EPINEPHRINE 0.3 MG/0.3ML IJ SOAJ: 0.3000 mg | INTRAMUSCULAR | 2 refills | Status: AC | PRN

## 2021-04-23 NOTE — Progress Notes (Signed)
[address]o [place] [address] Dept: [phone]  FOLLOW UP NOTE  Patient ID: [name], Dr., male    DOB: 1956-03-14  Age: 65 y.o. MRN: [id number] Date of Office Visit: 04/23/2021  Assessment  Chief Complaint: Follow-up (Patient in today for a follow up and has no new problems.)  HPI EMMAUS BRANDI, Dr. Is a 65 year old male who presents today for follow-up of perennial allergic rhinitis, acute nonrecurrent pansinusitis, and laryngopharyngeal reflux disease.  He was last seen on September 05, 2019 by Dr. Lucie Leather.  Perennial allergic rhinitis is reported as controlled with Dymista 1 spray each nostril twice a day as needed and allergy injections per protocol.  He reports that he feels like his allergy injections are helping and he denies any large local reactions.  He would like to continue receiving allergy injections since he still works as a International aid/development worker some.  He reports occasional rhinorrhea and nasal congestion.  He denies postnasal drip.  He has not had any sinus infections since we last saw him.  He requests a refill of Dymista and his epinephrine autoinjector device.  Laryngopharyngeal reflux disease is reported as moderately controlled with omeprazole 40 mg once a day and famotidine 40 mg once a day.  He reports that he will occasionally drink 5 cups of coffee and will have have symptoms.   Drug Allergies:  No Known Allergies  Review of Systems: Review of Systems  Constitutional:  Negative for chills and fever.  HENT:         Reports occasional rhinorrhea and nasal congestion.  Denies postnasal drip  Eyes:        Reports occasional itchy watery eyes  Respiratory:  Negative for cough, shortness of breath and wheezing.   Cardiovascular:  Negative for chest pain and palpitations.  Gastrointestinal:        Reports reflux depending on how much coffee he drinks in the morning  Genitourinary:  Negative for frequency.  Skin:  Negative for itching and rash.   Neurological:  Negative for headaches.  Endo/Heme/Allergies:  Positive for environmental allergies.    Physical Exam: BP 124/72   Pulse 68   Temp 98.2 F (36.8 C) (Temporal)   Resp 16   Ht 6\' 1"  (1.854 m)   Wt 213 lb (96.6 kg)   SpO2 96%   BMI 28.10 kg/m    Physical Exam Constitutional:      Appearance: Normal appearance.  HENT:     Head: Normocephalic and atraumatic.     Comments: Pharynx normal, eyes normal, ears normal, nose normal    Right Ear: Tympanic membrane, ear canal and external ear normal.     Left Ear: Tympanic membrane, ear canal and external ear normal.     Nose: Nose normal.     Mouth/Throat:     Mouth: Mucous membranes are moist.     Pharynx: Oropharynx is clear.  Eyes:     Conjunctiva/sclera: Conjunctivae normal.  Cardiovascular:     Rate and Rhythm: Regular rhythm.     Heart sounds: Normal heart sounds.  Pulmonary:     Effort: Pulmonary effort is normal.     Breath sounds: Normal breath sounds.     Comments: Lungs clear to auscultation Musculoskeletal:     Cervical back: Neck supple.  Skin:    General: Skin is warm.  Neurological:     Mental Status: He is alert and oriented to person, place, and time.  Psychiatric:  Mood and Affect: Mood normal.        Behavior: Behavior normal.        Thought Content: Thought content normal.        Judgment: Judgment normal.    Diagnostics:  None  Assessment and Plan: 1. Acute non-recurrent pansinusitis   2. LPRD (laryngopharyngeal reflux disease)     Meds ordered this encounter  Medications   EPINEPHrine (EPIPEN 2-PAK) 0.3 mg/0.3 mL IJ SOAJ injection    Sig: Inject 0.3 mg into the muscle as needed for anaphylaxis.    Dispense:  2 each    Refill:  2   Azelastine-Fluticasone 137-50 MCG/ACT SUSP    Sig: SHAKE LIQUID AND USE 1 TO 2 SPRAYS IN EACH NOSTRIL TWICE DAILY    Dispense:  23 g    Refill:  5     Patient Instructions  Perennial allergic rhinitis Continue allergy injections per  protocol and have access to epinephrine autoinjector device. Continue Dymista 1 spray each nostril twice a day as needed for runny/stuffy nose May use saline nasal rinse or saline nasal spray as needed for nasal symptoms.  Use this prior to any medicated nasal sprays.  Laryngopharyngeal reflux disease Continue dietary and lifestyle modifications Continue omeprazole 40 mg  once a day Continue famotidine 40 mg once a day  Please let us know if this treatment plan is not working well for you. Schedule a follow-up appointment in 12 months or sooner if needed Return in about 1 year (around 04/23/2022), or if symptoms worsen or fail to improve.    Thank you for the opportunity to care for this patient.  Please do not hesitate to contact me with questions.  Nehemiah Settle, FNP [facility]

## 2021-04-30 ENCOUNTER — Ambulatory Visit (INDEPENDENT_AMBULATORY_CARE_PROVIDER_SITE_OTHER): Payer: Medicare Other | Admitting: *Deleted

## 2021-04-30 DIAGNOSIS — J309 Allergic rhinitis, unspecified: Secondary | ICD-10-CM

## 2021-05-06 ENCOUNTER — Other Ambulatory Visit: Payer: Self-pay | Admitting: Internal Medicine

## 2021-05-19 ENCOUNTER — Other Ambulatory Visit: Payer: Self-pay | Admitting: *Deleted

## 2021-05-19 ENCOUNTER — Other Ambulatory Visit: Payer: Self-pay

## 2021-05-19 MED ORDER — LOSARTAN POTASSIUM 25 MG PO TABS: 25.0000 mg | ORAL_TABLET | Freq: Every day | ORAL | 0 refills | Status: DC

## 2021-05-19 MED ORDER — LOSARTAN POTASSIUM 25 MG PO TABS
25.0000 mg | ORAL_TABLET | Freq: Every day | ORAL | 0 refills | Status: DC
Start: 2021-05-19 — End: 2021-05-20

## 2021-05-20 ENCOUNTER — Telehealth: Payer: Self-pay | Admitting: Internal Medicine

## 2021-05-20 MED ORDER — PRAVASTATIN SODIUM 80 MG PO TABS: 80.0000 mg | ORAL_TABLET | Freq: Every evening | ORAL | 1 refills | Status: DC

## 2021-05-20 MED ORDER — LOSARTAN POTASSIUM 25 MG PO TABS: 25.0000 mg | ORAL_TABLET | Freq: Every day | ORAL | 1 refills | Status: DC

## 2021-05-20 NOTE — Telephone Encounter (Signed)
*  STAT* If patient is at the pharmacy, call can be transferred to refill team.   1. Which medications need to be refilled? (please list name of each medication and dose if known) losartan (COZAAR) 25 MG tablet   pravastatin (PRAVACHOL) 80 MG tablet (refilled w/ [facility])  2. Which pharmacy/location (including street and city if local pharmacy) is medication to be sent to? [facility] [phone] - [place], [place] - [address]  [facility]  [facility]) - [place], [place] - [address] [place]   3. Do they need a 30 day or 90 day supply? 90 ds

## 2021-05-20 NOTE — Telephone Encounter (Signed)
Pt scheduled to see Dr. Tenny Craw 10/16/2021.  Will send in refills to [facility],

## 2021-07-09 ENCOUNTER — Telehealth: Payer: Self-pay | Admitting: Allergy and Immunology

## 2021-07-09 NOTE — Telephone Encounter (Signed)
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 Larita Fife receive his immunotherapy and he can have his RN family member administer this medication.  We just need his RN family member to complete some paperwork which we can provide him and once that returns to [place] we can give him his immunotherapy.

## 2021-07-09 NOTE — Telephone Encounter (Signed)
Tyler Taylor came into the office and states he is moving to [place] and wanted to know how he could go about getting his vials.  He states his daughter is an Charity fundraiser and states he would purchase his vials so he can take them to [place].  When I tried to explain to him how to go about getting his vials, he asked to speak to you.  I told him you were in [place] today and he handed me his business card and told me to send you a message asking you to call him on his cell [phone] so he can discuss with you his options.

## 2021-07-14 NOTE — Telephone Encounter (Signed)
Called and spoke with the patient and advised of Dr. Kathyrn Lass plan. Patient was fine with this and will stop by tomorrow to pick up the paperwork to have his daughter complete. I will have the paperwork ready for him to pickup in the [place] injection room.

## 2021-07-15 ENCOUNTER — Ambulatory Visit (INDEPENDENT_AMBULATORY_CARE_PROVIDER_SITE_OTHER): Payer: Medicare Other | Admitting: *Deleted

## 2021-07-15 DIAGNOSIS — J309 Allergic rhinitis, unspecified: Secondary | ICD-10-CM

## 2021-07-15 NOTE — Telephone Encounter (Signed)
Patient came in and picked up paperwork. He will bring it back filled out and will pick up his vials at that time.

## 2021-07-22 ENCOUNTER — Ambulatory Visit (INDEPENDENT_AMBULATORY_CARE_PROVIDER_SITE_OTHER): Payer: Medicare Other | Admitting: *Deleted

## 2021-07-22 ENCOUNTER — Other Ambulatory Visit: Payer: Self-pay

## 2021-07-22 DIAGNOSIS — J309 Allergic rhinitis, unspecified: Secondary | ICD-10-CM

## 2021-07-22 NOTE — Progress Notes (Signed)
Immunotherapy   Patient Details  Name: LAKSH HINNERS, Dr. MRN: [id number] Date of Birth: 1956-02-25  07/22/2021  [name], Dr. here to pick up  DM-WEED, G-T, C-D Following schedule: C  Frequency: Every 4 Weeks, Pt will B/U  10, then .50 Every 4 Weeks when starting new vials  Epi-Pen:Epi-Pen Available  Consent signed and patient instructions given. Patient picked up his vials today to continue his injection in [place] where his daughter who is an Charity fundraiser will continue to administer his injections. He is aware to call 2 weeks prior to make his vials. He will likely want them mailed to him and is aware to let us know when he calls.   Diamonds Lippard Fernandez-Vernon 07/22/2021, 11:17 AM

## 2021-08-21 ENCOUNTER — Telehealth: Payer: Self-pay | Admitting: Internal Medicine

## 2021-08-21 MED ORDER — EZETIMIBE 10 MG PO TABS: 10.0000 mg | ORAL_TABLET | Freq: Every day | ORAL | 1 refills | Status: DC

## 2021-08-21 NOTE — Addendum Note (Signed)
Addended by: Stephani Police on: 08/21/2021 11:33 AM   Modules accepted: Orders

## 2021-08-21 NOTE — Telephone Encounter (Signed)
RX sent in

## 2021-08-21 NOTE — Telephone Encounter (Signed)
Pt needs Zetia refilled at [facility] in [place]

## 2021-10-05 NOTE — Progress Notes (Unsigned)
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 male with a history of  Hyperlipidemia   I saw him for the first time last spring   He had been  foloowed by Geanie Kenning at [facility] in past  Has been there for years  Had GXT in remote past (1995) for CP  Negative  Placed on statin in 1990s   When I saw him initially I recomm he get a calcium score CT   His score was 17   CT also showed some small pulmonary nondules   F/U CT unchanged (though a new part of lung imaged on last screen  Small    Initially I recomm tighter contol of lipids and  Increased pravastatin to 80  Repeat lipomed showed very small drop in particles  With this I recomm he switch to Crestor  Unfortunately he developed muscle aches on the Cretor   Went back to 80 mg daily pravstatin  I saw him in clinic about 1 year ago   In the interval he has done well  He denies CP   Breathing is OK   He is working 1 wk per month. Log of BPs shows 130s/140s/       I last saw the pt in July 2021  Since seen he has done well  No CP  No palpitatiosn   NO dizziness   No SOB Moved to [place]    WIll need a new physician     Current Meds  Medication Sig   Azelastine-Fluticasone 137-50 MCG/ACT SUSP SHAKE LIQUID AND USE 1 TO 2 SPRAYS IN EACH NOSTRIL TWICE DAILY   diphenhydrAMINE (BENADRYL) 50 MG tablet Take 50 mg by mouth at bedtime.   docusate sodium (COLACE) 100 MG capsule Take 1 capsule (100 mg total) by mouth 2 (two) times daily. (Patient taking differently: Take 100 mg by mouth daily.)   EPINEPHrine (EPIPEN 2-PAK) 0.3 mg/0.3 mL IJ SOAJ injection Inject 0.3 mg into the muscle as needed for anaphylaxis.   ezetimibe (ZETIA) 10 MG tablet Take 1 tablet (10 mg total) by mouth  daily.   famotidine (PEPCID) 20 MG tablet Take 20 mg by mouth 2 (two) times daily.   loratadine (CLARITIN) 10 MG tablet Take 10 mg by mouth daily.   losartan (COZAAR) 25 MG tablet Take 1 tablet (25 mg total) by mouth daily.   Multiple Vitamin (MULTIVITAMIN WITH MINERALS) TABS Take 1 tablet by mouth daily.   omeprazole (PRILOSEC) 40 MG capsule Take 40 mg by mouth daily.   pravastatin (PRAVACHOL) 80 MG tablet Take 1 tablet (80 mg total) by mouth every evening. PT MUST KEEP APPT IN MARCH 2023 FOR FUTURE REFILLS     Allergies:   Patient has no known allergies.   Past Medical History:  Diagnosis Date   Complication of anesthesia    Family history of adverse reaction to anesthesia    severe ponv mother   GERD (gastroesophageal reflux disease)    Hemorrhoids    Hiatal hernia    Hyperlipemia, mixed    Nocturia    Pneumonia April 2013   walking   PONV (postoperative nausea and vomiting)  likes scopolamine patch and propofol helped with last surgery   Seasonal allergic rhinitis    SOB (shortness of breath) 15 yrs ago   Tension pneumothorax 2002   from diving    Past Surgical History:  Procedure Laterality Date   APPENDECTOMY     GANGLION CYST EXCISION     left wrist   HEMORRHOID SURGERY N/A 07/02/2017   Procedure: SINGLE COLUMN HEMORRHOIDECTOMY;  Surgeon: Romie Levee, MD;  Location: [facility];  Service: General;  Laterality: N/A;   SHOULDER SURGERY  09/2015   rotator cuff reattach superspinatour and bone spur removal   TONSILLECTOMY     TRANSANAL HEMORRHOIDAL DEARTERIALIZATION  07/22/2012   Procedure: TRANSANAL HEMORRHOIDAL DEARTERIALIZATION;  Surgeon: Romie Levee, MD;  Location: [facility];  Service: General;  Laterality: N/A;     Social History:  The patient  reports that he has never smoked. He has never used smokeless tobacco. He reports that he does not drink alcohol and does not use drugs.   Family History:  The patient's family history includes  Melanoma in his mother.    ROS:  Please see the history of present illness. All other systems are reviewed and  Negative to the above problem except as noted.    PHYSICAL EXAM: VS:  BP 140/88    Pulse 76    Ht 6\' 1"  (1.854 m)    Wt 214 lb (97.1 kg)    BMI 28.23 kg/m     GEN: Well nourished, well developed, in no acute distress  HEENT: normal  Neck: JVP is normal  NO carotid bruits Cardiac: RRR;  No LE edema  Respiratory:  clear to auscultation bilaterally, normal work of breathing GI: soft, nontender, nondistended, + BS  No hepatomegaly  MS: no deformity Moving all extremities   Skin: warm and dry, no rash Neuro:  Strength and sensation are intact Psych: euthymic mood, full affect   EKG:  EKG is ordered today.    SR 77 bpm    Lipid Panel    Component Value Date/Time   CHOL 165 12/15/2016 0833   CHOL 160 09/11/2016 1335   TRIG 130 12/15/2016 0833   HDL 46 12/15/2016 0833   CHOLHDL 3.9 09/11/2016 1335   LDLCALC 94 09/11/2016 1335      Wt Readings from Last 3 Encounters:  10/06/21 214 lb (97.1 kg)  04/23/21 213 lb (96.6 kg)  02/02/20 224 lb 12.8 oz (102 kg)      ASSESSMENT AND PLAN:  1  Dyslipidemia  WIll get lipomed today   Discussed diet, exercise   2.  HTN   REview of BP, still not optimal   Will check labs today   Consider increase   I also recomm wt loss, dietary changes when may reach goal without change in meds     3  Coronary calcifications   Ca Score is 17   Minimal plaquing  Keep on statin   No symptoms of angina    4  Pulmonary nodules  Stable on previous CT with no f/u recomm  Check CBC, CMET, TSH, PSA and lipomed  F/U in 1 year  Encouraged him to stay active   Current medicines are reviewed at length with the patient today.  The patient does not have concerns regarding medicines.  Signed, 04/04/20, MD  10/06/2021 8:30 AM    [facility] [address] [place], [place], [place]  [place] Phone: [phone]; Fax: [phone]

## 2021-10-06 ENCOUNTER — Ambulatory Visit (INDEPENDENT_AMBULATORY_CARE_PROVIDER_SITE_OTHER): Payer: Medicare Other | Admitting: Internal Medicine

## 2021-10-06 ENCOUNTER — Other Ambulatory Visit: Payer: Self-pay | Admitting: *Deleted

## 2021-10-06 ENCOUNTER — Encounter: Payer: Self-pay | Admitting: Internal Medicine

## 2021-10-06 ENCOUNTER — Other Ambulatory Visit: Payer: Self-pay

## 2021-10-06 VITALS — BP 140/88 | HR 76 | Ht 73.0 in | Wt 214.0 lb

## 2021-10-06 DIAGNOSIS — I10 Essential (primary) hypertension: Secondary | ICD-10-CM | POA: Diagnosis not present

## 2021-10-06 DIAGNOSIS — R5383 Other fatigue: Secondary | ICD-10-CM

## 2021-10-06 DIAGNOSIS — R7309 Other abnormal glucose: Secondary | ICD-10-CM

## 2021-10-06 DIAGNOSIS — R3911 Hesitancy of micturition: Secondary | ICD-10-CM

## 2021-10-06 DIAGNOSIS — Z79899 Other long term (current) drug therapy: Secondary | ICD-10-CM

## 2021-10-06 DIAGNOSIS — E785 Hyperlipidemia, unspecified: Secondary | ICD-10-CM | POA: Diagnosis not present

## 2021-10-06 MED ORDER — LOSARTAN POTASSIUM 25 MG PO TABS: 25.0000 mg | ORAL_TABLET | Freq: Every day | ORAL | 3 refills | Status: DC

## 2021-10-06 NOTE — Patient Instructions (Signed)
Medication Instructions:  ?Your physician recommends that you continue on your current medications as directed. Please refer to the Current Medication list given to you today. ? ?*If you need a refill on your cardiac medications before your next appointment, please call your pharmacy* ? ? ?Lab Work: ?CMET, CBC, TSH, NMR, HGBA1C, PSA ?If you have labs (blood work) drawn today and your tests are completely normal, you will receive your results only by: ?MyChart Message (if you have MyChart) OR ?A paper copy in the mail ?If you have any lab test that is abnormal or we need to change your treatment, we will call you to review the results. ? ? ?Testing/Procedures: ?NONE ? ? ?Follow-Up: ?At [facility], you and your health needs are our priority.  As part of our continuing mission to provide you with exceptional heart care, we have created designated Provider Care Teams.  These Care Teams include your primary Cardiologist (physician) and Advanced Practice Providers (APPs -  Physician Assistants and Nurse Practitioners) who all work together to provide you with the care you need, when you need it. ? ?We recommend signing up for the patient portal called "MyChart".  Sign up information is provided on this After Visit Summary.  MyChart is used to connect with patients for Virtual Visits (Telemedicine).  Patients are able to view lab/test results, encounter notes, upcoming appointments, etc.  Non-urgent messages can be sent to your provider as well.   ?To learn more about what you can do with MyChart, go to [email or web address].   ? ?

## 2021-10-07 LAB — COMPREHENSIVE METABOLIC PANEL
ALT: 23 IU/L (ref 0–44)
AST: 23 IU/L (ref 0–40)
Albumin/Globulin Ratio: 2.1 (ref 1.2–2.2)
Albumin: 4.6 g/dL (ref 3.8–4.8)
Alkaline Phosphatase: 59 IU/L (ref 44–121)
BUN/Creatinine Ratio: 12 (ref 10–24)
BUN: 14 mg/dL (ref 8–27)
Bilirubin Total: 0.4 mg/dL (ref 0.0–1.2)
CO2: 28 mmol/L (ref 20–29)
Calcium: 10 mg/dL (ref 8.6–10.2)
Chloride: 103 mmol/L (ref 96–106)
Creatinine, Ser: 1.17 mg/dL (ref 0.76–1.27)
Globulin, Total: 2.2 g/dL (ref 1.5–4.5)
Glucose: 104 mg/dL — ABNORMAL HIGH (ref 70–99)
Potassium: 4.6 mmol/L (ref 3.5–5.2)
Sodium: 140 mmol/L (ref 134–144)
Total Protein: 6.8 g/dL (ref 6.0–8.5)
eGFR: 68 mL/min/{1.73_m2} (ref >59)

## 2021-10-07 LAB — NMR, LIPOPROFILE
Cholesterol, Total: 126 mg/dL (ref 100–199)
HDL Particle Number: 35.2 umol/L (ref >=30.5)
HDL-C: 50 mg/dL (ref >39)
LDL Particle Number: 846 nmol/L (ref <1000)
LDL Size: 20.5 nm — ABNORMAL LOW (ref >20.5)
LDL-C (NIH Calc): 58 mg/dL (ref 0–99)
LP-IR Score: 50 — ABNORMAL HIGH (ref <=45)
Small LDL Particle Number: 327 nmol/L (ref <=527)
Triglycerides: 99 mg/dL (ref 0–149)

## 2021-10-07 LAB — CBC
Hematocrit: 44.3 % (ref 37.5–51.0)
Hemoglobin: 15.5 g/dL (ref 13.0–17.7)
MCH: 31.7 pg (ref 26.6–33.0)
MCHC: 35 g/dL (ref 31.5–35.7)
MCV: 91 fL (ref 79–97)
Platelets: 201 10*3/uL (ref 150–450)
RBC: 4.89 x10E6/uL (ref 4.14–5.80)
RDW: 12.2 % (ref 11.6–15.4)
WBC: 6.5 10*3/uL (ref 3.4–10.8)

## 2021-10-07 LAB — TSH: TSH: 1.97 u[IU]/mL (ref 0.450–4.500)

## 2021-10-07 LAB — HEMOGLOBIN A1C
Est. average glucose Bld gHb Est-mCnc: 114 mg/dL
Hgb A1c MFr Bld: 5.6 % (ref 4.8–5.6)

## 2021-10-07 LAB — PSA: Prostate Specific Ag, Serum: 1.3 ng/mL (ref 0.0–4.0)

## 2021-10-08 ENCOUNTER — Telehealth: Payer: Self-pay | Admitting: Internal Medicine

## 2021-10-08 MED ORDER — LOSARTAN POTASSIUM 50 MG PO TABS: 50.0000 mg | ORAL_TABLET | Freq: Every day | ORAL | 11 refills | Status: DC

## 2021-10-08 NOTE — Telephone Encounter (Signed)
Called patient to let him know losartan has been sent in to his pharmacy and he had a dose change. Patient is aware and thanked [place] for the call.  ?

## 2021-10-08 NOTE — Addendum Note (Signed)
Addended by: Virl Axe, Nettie Cromwell L on: 10/08/2021 04:05 PM ? ? Modules accepted: Orders ? ?

## 2021-10-08 NOTE — Telephone Encounter (Signed)
Patient wrote in that he is helping family in [place]    ?Needs losartan 50 mg daily called in to the [facility] there (on [place], [place], [place] ?THeir nuber is [phone].    ?He is to take 50 mg daily    Fill 1 month    11 refills  ?[facility]     Can call in years worth there  ?Please let pt know when done as he is travelling  ?

## 2021-10-09 ENCOUNTER — Other Ambulatory Visit: Payer: Self-pay

## 2021-10-09 MED ORDER — LOSARTAN POTASSIUM 50 MG PO TABS: 50.0000 mg | ORAL_TABLET | Freq: Every day | ORAL | 3 refills | Status: DC

## 2021-10-21 ENCOUNTER — Telehealth: Payer: Self-pay

## 2021-10-21 NOTE — Telephone Encounter (Signed)
Patient called back and has been placed on the schedule. Patient would like his vials mailed to: [facility] [address] [place], [place]  [address] ?

## 2021-10-21 NOTE — Telephone Encounter (Signed)
Patient called earlier regarding new allergy vials. I informed patient that we would need his treatment records, pt asked to e-mail them. I provider the allergy and asthma e-mail. Patient sent records, I reached out to set up an appointment to have them mailed left a voicemail for patient to return call. ?

## 2021-10-28 DIAGNOSIS — J302 Other seasonal allergic rhinitis: Secondary | ICD-10-CM | POA: Diagnosis not present

## 2021-10-28 NOTE — Progress Notes (Signed)
VIALS EXP 10-29-22 ?

## 2021-10-29 DIAGNOSIS — J3089 Other allergic rhinitis: Secondary | ICD-10-CM | POA: Diagnosis not present

## 2021-10-30 DIAGNOSIS — J3081 Allergic rhinitis due to animal (cat) (dog) hair and dander: Secondary | ICD-10-CM | POA: Diagnosis not present

## 2021-11-10 ENCOUNTER — Ambulatory Visit: Payer: Medicare Other | Admitting: *Deleted

## 2021-11-10 DIAGNOSIS — J309 Allergic rhinitis, unspecified: Secondary | ICD-10-CM

## 2021-11-10 NOTE — Progress Notes (Signed)
Immunotherapy ? ? ?Patient Details  ?Name: Tyler Taylor, Dr. ?MRN: [id number] ?Date of Birth: Oct 14, 1955 ? ?11/10/2021 ? ?Tyler Taylor, Dr.  DM-WEED, G-T, C-D ?Following schedule: C  ?Frequency: Weekly @.50 Every 4 Weeks  ?Epi-Pen: Yes  ?Consent signed and patient instructions given. ?Patient's Red vials have been mailed to patient at [address] [place], [address], patient's daughter will continue to administer his allergy injections since she is an Charity fundraiser per Dr. Lucie Leather.  ? ? ?Tyler Taylor ?11/10/2021, 9:15 AM ? ? ?

## 2021-11-14 ENCOUNTER — Other Ambulatory Visit: Payer: Self-pay | Admitting: Internal Medicine

## 2022-01-05 ENCOUNTER — Other Ambulatory Visit: Payer: Self-pay | Admitting: Family

## 2022-01-05 NOTE — Telephone Encounter (Signed)
Ok to send refill, but change to 1 spray in each nostril twice a day with 2 refills

## 2022-02-23 ENCOUNTER — Other Ambulatory Visit: Payer: Self-pay

## 2022-02-23 MED ORDER — EZETIMIBE 10 MG PO TABS: 10.0000 mg | ORAL_TABLET | Freq: Every day | ORAL | 2 refills | Status: AC

## 2022-04-27 ENCOUNTER — Other Ambulatory Visit: Payer: Self-pay | Admitting: Family

## 2022-06-10 ENCOUNTER — Other Ambulatory Visit: Payer: Self-pay | Admitting: Family

## 2022-06-12 ENCOUNTER — Telehealth: Payer: Self-pay | Admitting: Allergy and Immunology

## 2022-06-12 NOTE — Telephone Encounter (Signed)
Patient is requesting refill for nasal spray. Advised patient he needs a follow up visit as he was last seen Sept. 2022. Patient states he lives in [place] now & that he had spoken to Dr. Lucie Leather about moving & that he should be able to get a refill until he is seen.   Advised patient I could schedule an appointment for him in [place] if that is closer. Patient stated that didn't make difference as he lives 5 hours away. Patient did not schedule follow up.  Advised patient a courtesy refill was sent in for him already.   Patient is requesting a call back from Dr. Lucie Leather.   [phone]

## 2022-07-07 NOTE — Telephone Encounter (Signed)
Patient called and needs to talk with Cre about his injections.  [phone].

## 2022-07-07 NOTE — Telephone Encounter (Signed)
Called patient and was informed that he established care with Dr. Brent General, M.D. or [facility] [place], [place]. Patient expressed that the new office has contacted our office for medical records and have yet to get them.  I informed patient that this would be looked into and he would be notified with updates. Patient verbally expressed understanding and had no other questions or concerns at the time of the call.

## 2022-07-08 NOTE — Telephone Encounter (Signed)
Called and informed patient that his records were sent via fax last night with assistance to fax number [phone]. Patient expressed that he would call [facility] tomorrow at [phone] to confirm that the records made it without complication.

## 2022-09-19 ENCOUNTER — Encounter: Payer: Self-pay | Admitting: Internal Medicine

## 2022-09-24 ENCOUNTER — Other Ambulatory Visit: Payer: Self-pay | Admitting: Internal Medicine

## 2022-09-30 ENCOUNTER — Other Ambulatory Visit: Payer: Self-pay

## 2022-09-30 MED ORDER — LOSARTAN POTASSIUM 50 MG PO TABS: 50.0000 mg | ORAL_TABLET | Freq: Every day | ORAL | 0 refills | Status: AC

## 2022-10-01 ENCOUNTER — Other Ambulatory Visit: Payer: Self-pay | Admitting: *Deleted

## 2022-10-16 ENCOUNTER — Other Ambulatory Visit: Payer: Self-pay | Admitting: Internal Medicine
